# Patient Record
Sex: Female | Born: 1960
Health system: Southern US, Community
[De-identification: ages and names within clinical notes are randomized; demographics above are authoritative.]

## PROBLEM LIST (undated history)

## (undated) DIAGNOSIS — J45909 Unspecified asthma, uncomplicated: Secondary | ICD-10-CM

## (undated) DIAGNOSIS — E785 Hyperlipidemia, unspecified: Secondary | ICD-10-CM

## (undated) DIAGNOSIS — F3289 Other specified depressive episodes: Secondary | ICD-10-CM

## (undated) DIAGNOSIS — Z86010 Personal history of colonic polyps: Secondary | ICD-10-CM

## (undated) DIAGNOSIS — R51 Headache: Secondary | ICD-10-CM

## (undated) DIAGNOSIS — F329 Major depressive disorder, single episode, unspecified: Secondary | ICD-10-CM

## (undated) DIAGNOSIS — F411 Generalized anxiety disorder: Secondary | ICD-10-CM

## (undated) DIAGNOSIS — I10 Essential (primary) hypertension: Secondary | ICD-10-CM

## (undated) DIAGNOSIS — I341 Nonrheumatic mitral (valve) prolapse: Secondary | ICD-10-CM

## (undated) DIAGNOSIS — G8929 Other chronic pain: Secondary | ICD-10-CM

## (undated) DIAGNOSIS — E039 Hypothyroidism, unspecified: Secondary | ICD-10-CM

## (undated) DIAGNOSIS — M549 Dorsalgia, unspecified: Secondary | ICD-10-CM

## (undated) DIAGNOSIS — R002 Palpitations: Secondary | ICD-10-CM

## (undated) HISTORY — PX: OOPHORECTOMY: SHX86

## (undated) HISTORY — DX: Generalized anxiety disorder: F41.1

## (undated) HISTORY — DX: Hyperlipidemia, unspecified: E78.5

## (undated) HISTORY — DX: Essential (primary) hypertension: I10

## (undated) HISTORY — DX: Other chronic pain: G89.29

## (undated) HISTORY — DX: Hypothyroidism, unspecified: E03.9

## (undated) HISTORY — PX: APPENDECTOMY: SHX54

## (undated) HISTORY — DX: Other specified depressive episodes: F32.89

## (undated) HISTORY — DX: Palpitations: R00.2

## (undated) HISTORY — PX: FOOT SURGERY: SHX648

## (undated) HISTORY — DX: Major depressive disorder, single episode, unspecified: F32.9

## (undated) HISTORY — PX: TONSILLECTOMY: SUR1361

## (undated) HISTORY — DX: Dorsalgia, unspecified: M54.9

## (undated) HISTORY — DX: Headache: R51

## (undated) HISTORY — PX: ABDOMINAL HYSTERECTOMY: SHX81

## (undated) HISTORY — DX: Personal history of colonic polyps: Z86.010

## (undated) HISTORY — DX: Unspecified asthma, uncomplicated: J45.909

## (undated) HISTORY — DX: Nonrheumatic mitral (valve) prolapse: I34.1

---

## 1998-03-25 HISTORY — PX: KNEE SURGERY: SHX244

## 1998-05-25 ENCOUNTER — Ambulatory Visit (HOSPITAL_COMMUNITY): Admission: RE | Admit: 1998-05-25 | Discharge: 1998-05-25 | Payer: Self-pay | Admitting: Family Medicine

## 1998-05-25 ENCOUNTER — Encounter: Payer: Self-pay | Admitting: Family Medicine

## 1998-06-23 ENCOUNTER — Ambulatory Visit (HOSPITAL_COMMUNITY): Admission: RE | Admit: 1998-06-23 | Discharge: 1998-06-23 | Payer: Self-pay | Admitting: Family Medicine

## 1998-11-01 ENCOUNTER — Ambulatory Visit (HOSPITAL_COMMUNITY): Admission: RE | Admit: 1998-11-01 | Discharge: 1998-11-01 | Payer: Self-pay | Admitting: *Deleted

## 1999-01-26 ENCOUNTER — Emergency Department (HOSPITAL_COMMUNITY): Admission: EM | Admit: 1999-01-26 | Discharge: 1999-01-26 | Payer: Self-pay | Admitting: Emergency Medicine

## 1999-01-26 ENCOUNTER — Encounter: Payer: Self-pay | Admitting: Emergency Medicine

## 1999-02-19 ENCOUNTER — Encounter: Admission: RE | Admit: 1999-02-19 | Discharge: 1999-04-04 | Payer: Self-pay

## 1999-04-04 ENCOUNTER — Encounter: Admission: RE | Admit: 1999-04-04 | Discharge: 1999-04-04 | Payer: Self-pay | Admitting: *Deleted

## 1999-04-04 ENCOUNTER — Encounter: Payer: Self-pay | Admitting: *Deleted

## 1999-08-30 ENCOUNTER — Other Ambulatory Visit: Admission: RE | Admit: 1999-08-30 | Discharge: 1999-08-30 | Payer: Self-pay | Admitting: Internal Medicine

## 1999-11-06 ENCOUNTER — Encounter: Payer: Self-pay | Admitting: Emergency Medicine

## 1999-11-06 ENCOUNTER — Observation Stay (HOSPITAL_COMMUNITY): Admission: EM | Admit: 1999-11-06 | Discharge: 1999-11-07 | Payer: Self-pay | Admitting: Emergency Medicine

## 1999-11-07 ENCOUNTER — Encounter: Payer: Self-pay | Admitting: *Deleted

## 2001-01-19 ENCOUNTER — Encounter: Payer: Self-pay | Admitting: Neurosurgery

## 2001-01-19 ENCOUNTER — Encounter: Admission: RE | Admit: 2001-01-19 | Discharge: 2001-01-19 | Payer: Self-pay | Admitting: Neurosurgery

## 2001-01-29 ENCOUNTER — Encounter: Payer: Self-pay | Admitting: Neurosurgery

## 2001-01-29 ENCOUNTER — Encounter: Admission: RE | Admit: 2001-01-29 | Discharge: 2001-01-29 | Payer: Self-pay | Admitting: Neurosurgery

## 2001-03-19 ENCOUNTER — Ambulatory Visit (HOSPITAL_COMMUNITY): Admission: RE | Admit: 2001-03-19 | Discharge: 2001-03-19 | Payer: Self-pay | Admitting: Internal Medicine

## 2001-03-19 ENCOUNTER — Encounter: Payer: Self-pay | Admitting: Internal Medicine

## 2001-03-25 HISTORY — PX: CERVICAL FUSION: SHX112

## 2001-04-23 ENCOUNTER — Encounter (INDEPENDENT_AMBULATORY_CARE_PROVIDER_SITE_OTHER): Payer: Self-pay

## 2001-04-24 ENCOUNTER — Inpatient Hospital Stay (HOSPITAL_COMMUNITY): Admission: RE | Admit: 2001-04-24 | Discharge: 2001-04-26 | Payer: Self-pay | Admitting: Obstetrics and Gynecology

## 2002-08-16 ENCOUNTER — Ambulatory Visit (HOSPITAL_COMMUNITY): Admission: RE | Admit: 2002-08-16 | Discharge: 2002-08-16 | Payer: Self-pay | Admitting: Internal Medicine

## 2002-08-16 ENCOUNTER — Encounter: Payer: Self-pay | Admitting: Internal Medicine

## 2002-09-22 ENCOUNTER — Other Ambulatory Visit: Admission: RE | Admit: 2002-09-22 | Discharge: 2002-09-22 | Payer: Self-pay | Admitting: *Deleted

## 2002-11-15 ENCOUNTER — Inpatient Hospital Stay (HOSPITAL_COMMUNITY): Admission: EM | Admit: 2002-11-15 | Discharge: 2002-11-19 | Payer: Self-pay | Admitting: Internal Medicine

## 2002-11-16 ENCOUNTER — Encounter: Payer: Self-pay | Admitting: Internal Medicine

## 2002-11-18 ENCOUNTER — Encounter: Payer: Self-pay | Admitting: Neurological Surgery

## 2004-02-02 ENCOUNTER — Ambulatory Visit: Payer: Self-pay | Admitting: Professional

## 2004-02-09 ENCOUNTER — Ambulatory Visit: Payer: Self-pay | Admitting: Professional

## 2004-02-23 ENCOUNTER — Ambulatory Visit: Payer: Self-pay | Admitting: Professional

## 2004-03-15 ENCOUNTER — Ambulatory Visit: Payer: Self-pay | Admitting: Professional

## 2004-03-29 ENCOUNTER — Ambulatory Visit: Payer: Self-pay | Admitting: Professional

## 2004-04-12 ENCOUNTER — Ambulatory Visit: Payer: Self-pay | Admitting: Professional

## 2004-04-26 ENCOUNTER — Ambulatory Visit: Payer: Self-pay | Admitting: Internal Medicine

## 2004-12-17 ENCOUNTER — Ambulatory Visit: Payer: Self-pay | Admitting: Internal Medicine

## 2005-06-18 ENCOUNTER — Ambulatory Visit: Payer: Self-pay | Admitting: Internal Medicine

## 2005-09-26 ENCOUNTER — Ambulatory Visit: Payer: Self-pay | Admitting: Internal Medicine

## 2005-12-11 ENCOUNTER — Ambulatory Visit: Payer: Self-pay | Admitting: Internal Medicine

## 2006-01-02 ENCOUNTER — Ambulatory Visit: Payer: Self-pay | Admitting: Endocrinology

## 2006-01-03 ENCOUNTER — Ambulatory Visit: Payer: Self-pay

## 2006-02-19 ENCOUNTER — Ambulatory Visit: Payer: Self-pay | Admitting: Internal Medicine

## 2006-02-27 ENCOUNTER — Encounter: Admission: RE | Admit: 2006-02-27 | Discharge: 2006-02-27 | Payer: Self-pay | Admitting: Internal Medicine

## 2006-03-06 ENCOUNTER — Ambulatory Visit: Payer: Self-pay | Admitting: Internal Medicine

## 2006-06-19 ENCOUNTER — Ambulatory Visit: Payer: Self-pay | Admitting: Internal Medicine

## 2006-06-19 LAB — CONVERTED CEMR LAB
Albumin: 4.1 g/dL (ref 3.5–5.2)
Alkaline Phosphatase: 78 units/L (ref 39–117)
BUN: 10 mg/dL (ref 6–23)
Basophils Absolute: 0.1 10*3/uL (ref 0.0–0.1)
Cholesterol: 203 mg/dL (ref 0–200)
Direct LDL: 124.2 mg/dL
Eosinophils Absolute: 0.1 10*3/uL (ref 0.0–0.6)
Eosinophils Relative: 1.6 % (ref 0.0–5.0)
Folate: 11.1 ng/mL
GFR calc Af Amer: 100 mL/min
GFR calc non Af Amer: 83 mL/min
HDL: 48.3 mg/dL (ref >39.0)
MCHC: 34.5 g/dL (ref 30.0–36.0)
MCV: 93 fL (ref 78.0–100.0)
Monocytes Relative: 8.9 % (ref 3.0–11.0)
Platelets: 233 10*3/uL (ref 150–400)
Potassium: 4 meq/L (ref 3.5–5.1)
RBC: 4.18 M/uL (ref 3.87–5.11)
Sodium: 142 meq/L (ref 135–145)
TSH: 3.41 microintl units/mL (ref 0.35–5.50)
Triglycerides: 179 mg/dL — ABNORMAL HIGH (ref 0–149)
Vitamin B-12: 213 pg/mL (ref 211–911)
WBC: 7.7 10*3/uL (ref 4.5–10.5)

## 2006-06-27 ENCOUNTER — Ambulatory Visit (HOSPITAL_COMMUNITY): Admission: RE | Admit: 2006-06-27 | Discharge: 2006-06-27 | Payer: Self-pay | Admitting: Internal Medicine

## 2006-10-01 ENCOUNTER — Ambulatory Visit: Payer: Self-pay | Admitting: Internal Medicine

## 2006-10-02 DIAGNOSIS — R519 Headache, unspecified: Secondary | ICD-10-CM | POA: Insufficient documentation

## 2006-10-02 DIAGNOSIS — J45909 Unspecified asthma, uncomplicated: Secondary | ICD-10-CM | POA: Insufficient documentation

## 2006-10-02 DIAGNOSIS — F32A Depression, unspecified: Secondary | ICD-10-CM | POA: Insufficient documentation

## 2006-10-02 DIAGNOSIS — R51 Headache: Secondary | ICD-10-CM

## 2006-10-02 DIAGNOSIS — G8929 Other chronic pain: Secondary | ICD-10-CM | POA: Insufficient documentation

## 2006-10-02 DIAGNOSIS — F329 Major depressive disorder, single episode, unspecified: Secondary | ICD-10-CM | POA: Insufficient documentation

## 2006-12-22 ENCOUNTER — Ambulatory Visit: Payer: Self-pay | Admitting: Internal Medicine

## 2007-01-06 ENCOUNTER — Ambulatory Visit: Payer: Self-pay | Admitting: Internal Medicine

## 2007-01-07 ENCOUNTER — Encounter: Payer: Self-pay | Admitting: Internal Medicine

## 2007-01-07 DIAGNOSIS — J189 Pneumonia, unspecified organism: Secondary | ICD-10-CM | POA: Insufficient documentation

## 2007-01-07 DIAGNOSIS — R002 Palpitations: Secondary | ICD-10-CM | POA: Insufficient documentation

## 2007-01-07 DIAGNOSIS — G43009 Migraine without aura, not intractable, without status migrainosus: Secondary | ICD-10-CM | POA: Insufficient documentation

## 2007-01-07 DIAGNOSIS — F411 Generalized anxiety disorder: Secondary | ICD-10-CM | POA: Insufficient documentation

## 2007-01-07 DIAGNOSIS — J309 Allergic rhinitis, unspecified: Secondary | ICD-10-CM | POA: Insufficient documentation

## 2007-05-28 ENCOUNTER — Ambulatory Visit: Payer: Self-pay | Admitting: Internal Medicine

## 2007-05-28 DIAGNOSIS — M549 Dorsalgia, unspecified: Secondary | ICD-10-CM | POA: Insufficient documentation

## 2007-06-11 ENCOUNTER — Telehealth (INDEPENDENT_AMBULATORY_CARE_PROVIDER_SITE_OTHER): Payer: Self-pay | Admitting: *Deleted

## 2007-06-12 ENCOUNTER — Ambulatory Visit: Payer: Self-pay | Admitting: Internal Medicine

## 2007-06-12 ENCOUNTER — Telehealth (INDEPENDENT_AMBULATORY_CARE_PROVIDER_SITE_OTHER): Payer: Self-pay | Admitting: *Deleted

## 2007-06-12 DIAGNOSIS — H60399 Other infective otitis externa, unspecified ear: Secondary | ICD-10-CM | POA: Insufficient documentation

## 2008-05-24 ENCOUNTER — Observation Stay (HOSPITAL_COMMUNITY): Admission: EM | Admit: 2008-05-24 | Discharge: 2008-05-25 | Payer: Self-pay | Admitting: Emergency Medicine

## 2008-05-24 ENCOUNTER — Ambulatory Visit: Payer: Self-pay | Admitting: Internal Medicine

## 2008-06-08 ENCOUNTER — Ambulatory Visit: Payer: Self-pay | Admitting: Internal Medicine

## 2008-06-08 DIAGNOSIS — E785 Hyperlipidemia, unspecified: Secondary | ICD-10-CM | POA: Insufficient documentation

## 2008-06-08 DIAGNOSIS — I10 Essential (primary) hypertension: Secondary | ICD-10-CM | POA: Insufficient documentation

## 2008-06-14 ENCOUNTER — Encounter: Admission: RE | Admit: 2008-06-14 | Discharge: 2008-06-14 | Payer: Self-pay | Admitting: Internal Medicine

## 2008-06-14 LAB — HM MAMMOGRAPHY: HM Mammogram: NEGATIVE

## 2008-07-29 ENCOUNTER — Ambulatory Visit: Payer: Self-pay | Admitting: Endocrinology

## 2008-07-29 DIAGNOSIS — J069 Acute upper respiratory infection, unspecified: Secondary | ICD-10-CM | POA: Insufficient documentation

## 2008-08-12 ENCOUNTER — Telehealth: Payer: Self-pay | Admitting: Internal Medicine

## 2008-08-12 ENCOUNTER — Ambulatory Visit: Payer: Self-pay | Admitting: Internal Medicine

## 2008-08-12 DIAGNOSIS — J029 Acute pharyngitis, unspecified: Secondary | ICD-10-CM | POA: Insufficient documentation

## 2008-08-12 LAB — CONVERTED CEMR LAB
ALT: 22 units/L (ref 0–35)
AST: 17 units/L (ref 0–37)
Cholesterol: 189 mg/dL (ref 0–200)
HDL: 44 mg/dL (ref >39.00)
Total Protein: 7.3 g/dL (ref 6.0–8.3)
VLDL: 27 mg/dL (ref 0.0–40.0)

## 2009-04-18 ENCOUNTER — Ambulatory Visit: Payer: Self-pay | Admitting: Internal Medicine

## 2009-04-18 DIAGNOSIS — IMO0002 Reserved for concepts with insufficient information to code with codable children: Secondary | ICD-10-CM | POA: Insufficient documentation

## 2009-06-09 ENCOUNTER — Telehealth (INDEPENDENT_AMBULATORY_CARE_PROVIDER_SITE_OTHER): Payer: Self-pay | Admitting: *Deleted

## 2009-11-02 ENCOUNTER — Ambulatory Visit: Payer: Self-pay | Admitting: Internal Medicine

## 2009-11-02 DIAGNOSIS — J019 Acute sinusitis, unspecified: Secondary | ICD-10-CM | POA: Insufficient documentation

## 2009-11-10 ENCOUNTER — Telehealth (INDEPENDENT_AMBULATORY_CARE_PROVIDER_SITE_OTHER): Payer: Self-pay | Admitting: *Deleted

## 2009-12-12 ENCOUNTER — Encounter: Payer: Self-pay | Admitting: Internal Medicine

## 2010-01-02 ENCOUNTER — Encounter: Payer: Self-pay | Admitting: Internal Medicine

## 2010-03-05 ENCOUNTER — Ambulatory Visit: Payer: Self-pay | Admitting: Internal Medicine

## 2010-04-26 NOTE — Letter (Signed)
Summary: [facility]   Imported By: Sherian Rein 02/09/2010 11:14:43  _____________________________________________________________________  External Attachment:    Type:   Image     Comment:   External Document

## 2010-04-26 NOTE — Assessment & Plan Note (Signed)
Summary: SINUS/EARS/NWS   Vital Signs:  Patient profile:   50 year old female Height:      70.5 inches Weight:      248.38 pounds BMI:     35.26 O2 Sat:      97 % on Room air Temp:     97.8 degrees F oral Pulse rate:   81 / minute BP sitting:   126 / 80  (left arm) Cuff size:   large  Vitals Entered By: Zella Ball Ewing CMA Duncan Dull) (November 02, 2009 11:25 AM)  O2 Flow:  Room air CC: sinus congestion, face swollen, ear discomfort/RE   CC:  sinus congestion, face swollen, and ear discomfort/RE.  History of Present Illness: here with w2 -3 days onset right facial pain, pressure, fever and greenish d/c, with slight ST but no cough and Pt denies CP, sob, doe, wheezing, orthopnea, pnd, worsening LE edema, palps, dizziness or syncope   Pt denies new neuro symptoms such as headache, facial or extremity weakness   Does have increased severity overall of the lower back pain, unable to sleep lying flat in bed as pain is excruciating;  was not able to consider ortho or MRI after last visit jan 2011 due to cost;  now with pain similar as per HPI jan 2011 just more constant and more severe.  No falls or injury. No worsening bowel or bladder symtpom. No fever, wt loss, night sweats, loss of appetite or other constitutional symptoms Very tearful - " I just want to get it fixed"  Stopped all of her meds in frustration 2 mo ago, now states she is wiling to take her meds, needs refills overall.  no tolerability issues.  Denies suicidal ideation or panic  Problems Prior to Update: 1)  Sinusitis- Acute-nos  (ICD-461.9) 2)  Lumbar Radiculopathy, Left  (ICD-724.4) 3)  Back Pain  (ICD-724.5) 4)  Hepatotoxicity, Drug-induced, Risk of  (ICD-V58.69) 5)  Acute Pharyngitis  (ICD-462) 6)  Uri  (ICD-465.9) 7)  Hyperlipidemia  (ICD-272.4) 8)  Hypertension  (ICD-401.9) 9)  External Otitis  (ICD-380.10) 10)  Back Pain  (ICD-724.5) 11)  Pneumonia, Organism Nos  (ICD-486) 12)  Allergic Rhinitis  (ICD-477.9) 13)  Common  Migraine  (ICD-346.10) 14)  Symptom, Palpitations  (ICD-785.1) 15)  Anxiety  (ICD-300.00) 16)  Asthma  (ICD-493.90) 17)  Pain, Chronic Nec  (ICD-338.29) 18)  Headache  (ICD-784.0) 19)  Depression  (ICD-311)  Medications Prior to Update: 1)  Alprazolam 0.5 Mg  Tabs (Alprazolam) .Marland Kitchen.. 1 By Mouth Once Daily As Needed 2)  Fluoxetine Hcl 40 Mg Caps (Fluoxetine Hcl) .Marland Kitchen.. 1 By Mouth Once Daily 3)  Oxycodone Hcl 5 Mg Tabs (Oxycodone Hcl) .Marland Kitchen.. 1 - 3 By Mouth Q 6 Hrs As Needed Pain 4)  Robaxin-750 750 Mg Tabs (Methocarbamol) .Marland Kitchen.. 1 By Mouth Two Times A Day As Needed 5)  Metoprolol Tartrate 50 Mg Tabs (Metoprolol Tartrate) .Marland Kitchen.. 1 By Mouth Two Times A Day 6)  Pravachol 20 Mg Tabs (Pravastatin Sodium) .Marland Kitchen.. 1 By Mouth Once Daily 7)  Cephalexin 500 Mg Caps (Cephalexin) .Marland Kitchen.. 1 By Mouth Three Times A Day 8)  Fioricet 50-325-40 Mg Tabs (Butalbital-Apap-Caffeine) .Marland Kitchen.. 1po Once Daily As Needed 9)  Tessalon Perles 100 Mg Caps (Benzonatate) .Marland Kitchen.. 1 -2 By Mouth Three Times A Day As Needed Cough  Current Medications (verified): 1)  Alprazolam 0.5 Mg  Tabs (Alprazolam) .Marland Kitchen.. 1 By Mouth Once Daily As Needed 2)  Fluoxetine Hcl 40 Mg Caps (Fluoxetine Hcl) .Marland Kitchen.. 1 By Mouth  Once Daily 3)  Oxycodone Hcl 5 Mg Tabs (Oxycodone Hcl) .Marland Kitchen.. 1 - 3 By Mouth Q 6 Hrs As Needed Pain 4)  Robaxin-750 750 Mg Tabs (Methocarbamol) .Marland Kitchen.. 1 By Mouth Two Times A Day As Needed 5)  Metoprolol Tartrate 50 Mg Tabs (Metoprolol Tartrate) .Marland Kitchen.. 1 By Mouth Two Times A Day 6)  Pravachol 20 Mg Tabs (Pravastatin Sodium) .Marland Kitchen.. 1 By Mouth Once Daily 7)  Clarithromycin 500 Mg Tabs (Clarithromycin) .Marland Kitchen.. 1po Two Times A Day 8)  Fioricet 50-325-40 Mg Tabs (Butalbital-Apap-Caffeine) .Marland Kitchen.. 1po Once Daily As Needed  Allergies (verified): 1)  ! Sulfa 2)  ! Ecotrin Low Strength (Aspirin)  Past History:  Past Medical History: Last updated: 04/18/2009 Depression Headache Pain Asthma pneumonia 9/08 symptomatic palpitations Anxiety migraine Allergic  rhinitis Hypertension Hyperlipidemia recurrent LBP  Past Surgical History: Last updated: 10/02/2006 Caesarean section x2 Oophorectomy 1996/2000 Hysterectomy 1997 Appendectomy Tonsillectomy Left foot sugery 1997 Right Knee Surgery 2000 - Cartilage problem  Social History: Last updated: 06/12/2007 Current Smoker Alcohol use-yes Married  Risk Factors: Smoking Status: current (01/07/2007)  Review of Systems       all otherwise negative per pt -    Physical Exam  General:  alert and overweight-appearing.  , tearfull, mild ill  Head:  normocephalic and atraumatic.   Eyes:  vision grossly intact, pupils equal, and pupils round.   Ears:  bilat tms' mild red, sinus tender right > left Nose:  nasal dischargemucosal pallor and mucosal edema.   Mouth:  pharyngeal erythema and fair dentition.   Neck:  supple and no masses.   Lungs:  normal respiratory effort and normal breath sounds.   Heart:  normal rate and regular rhythm.   Abdomen:  soft, non-tender, and normal bowel sounds.   Msk:  spine nontender midline, has some tender mid lumbar paravertebral and upper left buttock area Neurologic:  cranial nerves II-XII intact, strength ? normal in lower extremities, and sensation intact to light touch.  except decreased sensation to LT to l2, l3 areas; and strength exam limited due to pain and effort   Impression & Recommendations:  Problem # 1:  SINUSITIS- ACUTE-NOS (ICD-461.9)  The following medications were removed from the medication list:    Tessalon Perles 100 Mg Caps (Benzonatate) .Marland Kitchen... 1 -2 by mouth three times a day as needed cough Her updated medication list for this problem includes:    Clarithromycin 500 Mg Tabs (Clarithromycin) .Marland Kitchen... 1po two times a day treat as above, f/u any worsening signs or symptoms   Problem # 2:  LUMBAR RADICULOPATHY, LEFT (ICD-724.4)  Her updated medication list for this problem includes:    Oxycodone Hcl 5 Mg Tabs (Oxycodone hcl) .Marland Kitchen... 1 -  3 by mouth q 6 hrs as needed pain    Robaxin-750 750 Mg Tabs (Methocarbamol) .Marland Kitchen... 1 by mouth two times a day as needed    Fioricet 50-325-40 Mg Tabs (Butalbital-apap-caffeine) .Marland Kitchen... 1po once daily as needed persists - worse overall pain subjective per pt;  exam little change ;  unable to get MRI done in [place] due to cost;  was not seen by local orhto as she owed them money;  ok for med refills, and refer [facility] ortho as may be able to get better tx there /more affordable  Problem # 3:  HYPERTENSION (ICD-401.9)  Her updated medication list for this problem includes:    Metoprolol Tartrate 50 Mg Tabs (Metoprolol tartrate) .Marland Kitchen... 1 by mouth two times a day  BP today: 126/80  Prior BP: 124/70 (04/18/2009)  Labs Reviewed: K+: 4.0 (06/19/2006) Creat: : 0.8 (06/19/2006)   Chol: 189 (08/12/2008)   HDL: 44.00 (08/12/2008)   LDL: 118 (08/12/2008)   TG: 135.0 (08/12/2008) stable overall by hx and exam, ok to continue meds/tx as is   Problem # 4:  HYPERLIPIDEMIA (ICD-272.4)  Her updated medication list for this problem includes:    Pravachol 20 Mg Tabs (Pravastatin sodium) .Marland Kitchen... 1 by mouth once daily  Labs Reviewed: SGOT: 17 (08/12/2008)   SGPT: 22 (08/12/2008)   HDL:44.00 (08/12/2008), 48.3 (06/19/2006)  LDL:118 (08/12/2008), DEL (06/19/2006)  Chol:189 (08/12/2008), 203 (06/19/2006)  Trig:135.0 (08/12/2008), 179 (06/19/2006) re-start med, f/u next visit  Complete Medication List: 1)  Alprazolam 0.5 Mg Tabs (Alprazolam) .Marland Kitchen.. 1 by mouth once daily as needed 2)  Fluoxetine Hcl 40 Mg Caps (Fluoxetine hcl) .Marland Kitchen.. 1 by mouth once daily 3)  Oxycodone Hcl 5 Mg Tabs (Oxycodone hcl) .Marland Kitchen.. 1 - 3 by mouth q 6 hrs as needed pain 4)  Robaxin-750 750 Mg Tabs (Methocarbamol) .Marland Kitchen.. 1 by mouth two times a day as needed 5)  Metoprolol Tartrate 50 Mg Tabs (Metoprolol tartrate) .Marland Kitchen.. 1 by mouth two times a day 6)  Pravachol 20 Mg Tabs (Pravastatin sodium) .Marland Kitchen.. 1 by mouth once daily 7)  Clarithromycin 500 Mg Tabs  (Clarithromycin) .Marland Kitchen.. 1po two times a day 8)  Fioricet 50-325-40 Mg Tabs (Butalbital-apap-caffeine) .Marland Kitchen.. 1po once daily as needed  Patient Instructions: 1)  Please take all new medications as prescribed  2)  Continue all previous medications as before this visit  3)  You will be contacted about the referral(s) to: orthopedic/[facility] 4)  Please schedule a follow-up appointment as needed. Prescriptions: PRAVACHOL 20 MG TABS (PRAVASTATIN SODIUM) 1 by mouth once daily  #90 x 3   Entered and Authorized by:   Corwin Levins MD   Signed by:   Corwin Levins MD on 11/02/2009   Method used:   Print then Give to Patient   RxID:   [id number] METOPROLOL TARTRATE 50 MG TABS (METOPROLOL TARTRATE) 1 by mouth two times a day  #60 x 11   Entered and Authorized by:   Corwin Levins MD   Signed by:   Corwin Levins MD on 11/02/2009   Method used:   Print then Give to Patient   RxID:   [id number] ROBAXIN-750 750 MG TABS (METHOCARBAMOL) 1 by mouth two times a day as needed  #60 x 2   Entered and Authorized by:   Corwin Levins MD   Signed by:   Corwin Levins MD on 11/02/2009   Method used:   Print then Give to Patient   RxID:   [id number] OXYCODONE HCL 5 MG TABS (OXYCODONE HCL) 1 - 3 by mouth q 6 hrs as needed pain  #120 x 0   Entered and Authorized by:   Corwin Levins MD   Signed by:   Corwin Levins MD on 11/02/2009   Method used:   Print then Give to Patient   RxID:   [id number] ALPRAZOLAM 0.5 MG  TABS (ALPRAZOLAM) 1 by mouth once daily as needed  #30 x 2   Entered and Authorized by:   Corwin Levins MD   Signed by:   Corwin Levins MD on 11/02/2009   Method used:   Print then Give to Patient   RxID:   [id number] FLUOXETINE HCL 40 MG CAPS (FLUOXETINE HCL) 1 by mouth once daily  #90  x 3   Entered and Authorized by:   Corwin Levins MD   Signed by:   Corwin Levins MD on 11/02/2009   Method used:   Print then Give to Patient   RxID:   [id number] CLARITHROMYCIN 500 MG TABS  (CLARITHROMYCIN) 1po two times a day  #20 x 0   Entered and Authorized by:   Corwin Levins MD   Signed by:   Corwin Levins MD on 11/02/2009   Method used:   Print then Give to Patient   RxID:   [id number]

## 2010-04-26 NOTE — Letter (Signed)
Summary: [facility]   Imported By: Lester Carthage 12/18/2009 09:10:09  _____________________________________________________________________  External Attachment:    Type:   Image     Comment:   External Document

## 2010-04-26 NOTE — Letter (Signed)
Summary: [facility]  [facility]   Imported By: Lennie Odor 01/08/2010 15:46:24  _____________________________________________________________________  External Attachment:    Type:   Image     Comment:   External Document

## 2010-04-26 NOTE — Progress Notes (Signed)
Summary: Records request from [facility]  Request for records received from [facility]. Request forwarded to [employer]. [name]  June 09, 2009 4:37 PM

## 2010-04-26 NOTE — Progress Notes (Signed)
Summary: REFERRRAL  Phone Note Call from Patient Call back at Home Phone [phone] Call back at [phone]   Caller: Patient Call For: Corwin Levins MD Summary of Call: Pt wants to know status of referral to [facility] for her lower back and neck. Pt was seen a wk ago and has had no calls. Please let pt know the status. Initial call taken by: Verdell Face,  November 10, 2009 10:11 AM  Follow-up for Phone Call        I woul defer to [facility] Follow-up by: Corwin Levins MD,  November 10, 2009 5:27 PM  Additional Follow-up for Phone Call Additional follow up Details #1::        There is no order  put in  for patient. Additional Follow-up by: Dagoberto Reef,  November 13, 2009 2:33 PM    Additional Follow-up for Phone Call Additional follow up Details #2::    done now Follow-up by: Corwin Levins MD,  November 13, 2009 2:55 PM

## 2010-04-26 NOTE — Assessment & Plan Note (Signed)
Summary: SORE THROAT--COUGH--SWOLLEN GLANDS---STC   Vital Signs:  Patient profile:   50 year old female Height:      70.5 inches Weight:      252.25 pounds BMI:     35.81 O2 Sat:      97 % on Room air Temp:     99.4 degrees F oral Pulse rate:   107 / minute BP sitting:   138 / 80  (left arm) Cuff size:   large  Vitals Entered By: Zella Ball Ewing CMA Duncan Dull) (March 05, 2010 4:28 PM)  O2 Flow:  Room air CC: Sore throat, cough, congestion, right ear pain/RE   CC:  Sore throat, cough, congestion, and right ear pain/RE.  History of Present Illness: here with acute onset 1-2 days mild to mod facial pain, pressure, fever, and greenish d/c;  has mild ST, and small non prod cough;  Pt denies CP, worsening sob, doe, wheezing, orthopnea, pnd, worsening LE edema, palps, dizziness or syncope  Pt denies new neuro symptoms such as headache, facial or extremity weakness Pt denies polydipsia, polyuria,  Preventive Screening-Counseling & Management      Drug Use:  no.    Problems Prior to Update: 1)  Sinusitis- Acute-nos  (ICD-461.9) 2)  Lumbar Radiculopathy, Left  (ICD-724.4) 3)  Back Pain  (ICD-724.5) 4)  Hepatotoxicity, Drug-induced, Risk of  (ICD-V58.69) 5)  Acute Pharyngitis  (ICD-462) 6)  Uri  (ICD-465.9) 7)  Hyperlipidemia  (ICD-272.4) 8)  Hypertension  (ICD-401.9) 9)  External Otitis  (ICD-380.10) 10)  Back Pain  (ICD-724.5) 11)  Pneumonia, Organism Nos  (ICD-486) 12)  Allergic Rhinitis  (ICD-477.9) 13)  Common Migraine  (ICD-346.10) 14)  Symptom, Palpitations  (ICD-785.1) 15)  Anxiety  (ICD-300.00) 16)  Asthma  (ICD-493.90) 17)  Pain, Chronic Nec  (ICD-338.29) 18)  Headache  (ICD-784.0) 19)  Depression  (ICD-311)  Medications Prior to Update: 1)  Alprazolam 0.5 Mg  Tabs (Alprazolam) .Marland Kitchen.. 1 By Mouth Once Daily As Needed 2)  Fluoxetine Hcl 40 Mg Caps (Fluoxetine Hcl) .Marland Kitchen.. 1 By Mouth Once Daily 3)  Oxycodone Hcl 5 Mg Tabs (Oxycodone Hcl) .Marland Kitchen.. 1 - 3 By Mouth Q 6 Hrs As Needed  Pain 4)  Robaxin-750 750 Mg Tabs (Methocarbamol) .Marland Kitchen.. 1 By Mouth Two Times A Day As Needed 5)  Metoprolol Tartrate 50 Mg Tabs (Metoprolol Tartrate) .Marland Kitchen.. 1 By Mouth Two Times A Day 6)  Pravachol 20 Mg Tabs (Pravastatin Sodium) .Marland Kitchen.. 1 By Mouth Once Daily 7)  Clarithromycin 500 Mg Tabs (Clarithromycin) .Marland Kitchen.. 1po Two Times A Day 8)  Fioricet 50-325-40 Mg Tabs (Butalbital-Apap-Caffeine) .Marland Kitchen.. 1po Once Daily As Needed  Current Medications (verified): 1)  Alprazolam 0.5 Mg  Tabs (Alprazolam) .Marland Kitchen.. 1 By Mouth Once Daily As Needed 2)  Fluoxetine Hcl 40 Mg Caps (Fluoxetine Hcl) .Marland Kitchen.. 1 By Mouth Once Daily 3)  Oxycodone Hcl 5 Mg Tabs (Oxycodone Hcl) .Marland Kitchen.. 1 - 3 By Mouth Q 6 Hrs As Needed Pain 4)  Robaxin-750 750 Mg Tabs (Methocarbamol) .Marland Kitchen.. 1 By Mouth Two Times A Day As Needed 5)  Metoprolol Tartrate 50 Mg Tabs (Metoprolol Tartrate) .Marland Kitchen.. 1 By Mouth Two Times A Day 6)  Pravachol 20 Mg Tabs (Pravastatin Sodium) .Marland Kitchen.. 1 By Mouth Once Daily 7)  Doxycycline Hyclate 100 Mg Caps (Doxycycline Hyclate) .Marland Kitchen.. 1po Two Times A Day 8)  Fioricet 50-325-40 Mg Tabs (Butalbital-Apap-Caffeine) .Marland Kitchen.. 1po Once Daily As Needed 9)  Tessalon Perles 100 Mg Caps (Benzonatate) .Marland Kitchen.. 1-2 By Mouth Three Times A Day As Needed  Cough  Allergies (verified): 1)  ! Sulfa 2)  ! Ecotrin Low Strength (Aspirin)  Past History:  Past Medical History: Last updated: 04/18/2009 Depression Headache Pain Asthma pneumonia 9/08 symptomatic palpitations Anxiety migraine Allergic rhinitis Hypertension Hyperlipidemia recurrent LBP  Past Surgical History: Last updated: 10/02/2006 Caesarean section x2 Oophorectomy 1996/2000 Hysterectomy 1997 Appendectomy Tonsillectomy Left foot sugery 1997 Right Knee Surgery 2000 - Cartilage problem  Social History: Last updated: 03/05/2010 Current Smoker Alcohol use-yes Married Drug use-no  Risk Factors: Smoking Status: current (01/07/2007)  Social History: Current Smoker Alcohol  use-yes Married Drug use-no Drug Use:  no  Review of Systems       all otherwise negative per pt -    Physical Exam  General:  alert and overweight-appearing.  , mild ill  Head:  normocephalic and atraumatic.   Eyes:  vision grossly intact, pupils equal, and pupils round.   Ears:  bilat tm's erythema, left > rigth without bulging and canals clear, sinus tender bilat Nose:  nasal dischargemucosal pallor and mucosal edema.   Mouth:  pharyngeal erythema and fair dentition.   Neck:  supple and cervical lymphadenopathy - tender Lungs:  normal respiratory effort and normal breath sounds.   Heart:  normal rate and regular rhythm.   Extremities:  no edema, no erythema    Impression & Recommendations:  Problem # 1:  SINUSITIS- ACUTE-NOS (ICD-461.9)  Her updated medication list for this problem includes:    Doxycycline Hyclate 100 Mg Caps (Doxycycline hyclate) .Marland Kitchen... 1po two times a day    Tessalon Perles 100 Mg Caps (Benzonatate) .Marland Kitchen... 1-2 by mouth three times a day as needed cough treat as above, f/u any worsening signs or symptoms   Complete Medication List: 1)  Alprazolam 0.5 Mg Tabs (Alprazolam) .Marland Kitchen.. 1 by mouth once daily as needed 2)  Fluoxetine Hcl 40 Mg Caps (Fluoxetine hcl) .Marland Kitchen.. 1 by mouth once daily 3)  Oxycodone Hcl 5 Mg Tabs (Oxycodone hcl) .Marland Kitchen.. 1 - 3 by mouth q 6 hrs as needed pain 4)  Robaxin-750 750 Mg Tabs (Methocarbamol) .Marland Kitchen.. 1 by mouth two times a day as needed 5)  Metoprolol Tartrate 50 Mg Tabs (Metoprolol tartrate) .Marland Kitchen.. 1 by mouth two times a day 6)  Pravachol 20 Mg Tabs (Pravastatin sodium) .Marland Kitchen.. 1 by mouth once daily 7)  Doxycycline Hyclate 100 Mg Caps (Doxycycline hyclate) .Marland Kitchen.. 1po two times a day 8)  Fioricet 50-325-40 Mg Tabs (Butalbital-apap-caffeine) .Marland Kitchen.. 1po once daily as needed 9)  Tessalon Perles 100 Mg Caps (Benzonatate) .Marland Kitchen.. 1-2 by mouth three times a day as needed cough  Patient Instructions: 1)  Please take all new medications as prescribed 2)   Continue all previous medications as before this visit  3)  Please schedule a follow-up appointment as needed. Prescriptions: TESSALON PERLES 100 MG CAPS (BENZONATATE) 1-2 by mouth three times a day as needed cough  #60 x 0   Entered and Authorized by:   Corwin Levins MD   Signed by:   Corwin Levins MD on 03/05/2010   Method used:   Print then Give to Patient   RxID:   [id number] DOXYCYCLINE HYCLATE 100 MG CAPS (DOXYCYCLINE HYCLATE) 1po two times a day  #20 x 0   Entered and Authorized by:   Corwin Levins MD   Signed by:   Corwin Levins MD on 03/05/2010   Method used:   Print then Give to Patient   RxID:   [id number]    Orders Added: 1)  Est. Patient Level III [id number]

## 2010-04-26 NOTE — Assessment & Plan Note (Signed)
Summary: severe back pain into legs--d/t--er/urg care if worsen--stc   Vital Signs:  Patient profile:   50 year old female Height:      69 inches Weight:      239 pounds BMI:     35.42 O2 Sat:      98 % on Room air Temp:     97.8 degrees F oral Pulse rate:   89 / minute BP sitting:   124 / 70  (left arm) Cuff size:   large  Vitals Entered ByMarland Kitchen Zella Ball Ewing (April 18, 2009 3:10 PM)  O2 Flow:  Room air CC: back pain, refills,cough/RE   CC:  back pain, refills, and cough/RE.  History of Present Illness: here with 4 wks ongoing severe pain, cant sleep, 8/10,  across the lower back, radiaties to below the knee on the left side;  no bowel or bladder symtpoms; no falls , fever, wt loss, night sweats;  worse to stand up, bend over to tie the shoes or turning or twisting;  was not able to be seen per ortho with last episode of pain flare due to owing money to ortho.  Using the hydrocodone sparingly but not helpign anyway.  flexeril not working as well.    also incidently with facial pain, pressure, and greenish d/c for 3 days.   Problems Prior to Update: 1)  Pharyngitis-acute  (ICD-462) 2)  Lumbar Radiculopathy, Left  (ICD-724.4) 3)  Back Pain  (ICD-724.5) 4)  Hepatotoxicity, Drug-induced, Risk of  (ICD-V58.69) 5)  Acute Pharyngitis  (ICD-462) 6)  Uri  (ICD-465.9) 7)  Hyperlipidemia  (ICD-272.4) 8)  Hypertension  (ICD-401.9) 9)  External Otitis  (ICD-380.10) 10)  Back Pain  (ICD-724.5) 11)  Pneumonia, Organism Nos  (ICD-486) 12)  Allergic Rhinitis  (ICD-477.9) 13)  Common Migraine  (ICD-346.10) 14)  Symptom, Palpitations  (ICD-785.1) 15)  Anxiety  (ICD-300.00) 16)  Asthma  (ICD-493.90) 17)  Pain, Chronic Nec  (ICD-338.29) 18)  Headache  (ICD-784.0) 19)  Depression  (ICD-311)  Medications Prior to Update: 1)  Alprazolam 0.5 Mg  Tabs (Alprazolam) .Marland Kitchen.. 1 By Mouth Once Daily As Needed 2)  Fluoxetine Hcl 40 Mg Caps (Fluoxetine Hcl) .Marland Kitchen.. 1 By Mouth Once Daily 3)  Vicodin 5-500 Mg   Tabs (Hydrocodone-Acetaminophen) .Marland Kitchen.. 1po Three Times A Day Prn 4)  Flexeril 5 Mg  Tabs (Cyclobenzaprine Hcl) .Marland Kitchen.. 1 By Mouth Three Times A Day Prn 5)  Metoprolol Tartrate 50 Mg Tabs (Metoprolol Tartrate) .Marland Kitchen.. 1 By Mouth Two Times A Day 6)  Pravachol 20 Mg Tabs (Pravastatin Sodium) .Marland Kitchen.. 1 By Mouth Once Daily 7)  Azithromycin 250 Mg Tabs (Azithromycin) .... 2po Qd For 1 Day, Then 1po Qd For 4days, Then Stop 8)  Fioricet 50-325-40 Mg Tabs (Butalbital-Apap-Caffeine) .Marland Kitchen.. 1po Once Daily As Needed  Current Medications (verified): 1)  Alprazolam 0.5 Mg  Tabs (Alprazolam) .Marland Kitchen.. 1 By Mouth Once Daily As Needed 2)  Fluoxetine Hcl 40 Mg Caps (Fluoxetine Hcl) .Marland Kitchen.. 1 By Mouth Once Daily 3)  Oxycodone Hcl 5 Mg Tabs (Oxycodone Hcl) .Marland Kitchen.. 1 - 3 By Mouth Q 6 Hrs As Needed Pain 4)  Robaxin-750 750 Mg Tabs (Methocarbamol) .Marland Kitchen.. 1 By Mouth Two Times A Day As Needed 5)  Metoprolol Tartrate 50 Mg Tabs (Metoprolol Tartrate) .Marland Kitchen.. 1 By Mouth Two Times A Day 6)  Pravachol 20 Mg Tabs (Pravastatin Sodium) .Marland Kitchen.. 1 By Mouth Once Daily 7)  Cephalexin 500 Mg Caps (Cephalexin) .Marland Kitchen.. 1 By Mouth Three Times A Day 8)  Fioricet 50-325-40  Mg Tabs (Butalbital-Apap-Caffeine) .Marland Kitchen.. 1po Once Daily As Needed 9)  Tessalon Perles 100 Mg Caps (Benzonatate) .Marland Kitchen.. 1 -2 By Mouth Three Times A Day As Needed Cough  Allergies (verified): 1)  ! Sulfa 2)  ! Ecotrin Low Strength (Aspirin)  Past History:  Past Surgical History: Last updated: 10/02/2006 Caesarean section x2 Oophorectomy 1996/2000 Hysterectomy 1997 Appendectomy Tonsillectomy Left foot sugery 1997 Right Knee Surgery 2000 - Cartilage problem  Social History: Last updated: 06/12/2007 Current Smoker Alcohol use-yes Married  Risk Factors: Smoking Status: current (01/07/2007)  Past Medical History: Depression Headache Pain Asthma pneumonia 9/08 symptomatic palpitations Anxiety migraine Allergic rhinitis Hypertension Hyperlipidemia recurrent LBP  Review of  Systems       all otherwise negative per pt -   Physical Exam  General:  alert and overweight-appearing.   Head:  normocephalic and atraumatic.   Eyes:  vision grossly intact, pupils equal, and pupils round.   Ears:  bilat tm's red, sinus tender bialt Nose:  nasal dischargemucosal pallor and mucosal erythema.   Mouth:  pharyngeal erythema and fair dentition.   Neck:  supple and no masses.   Lungs:  normal respiratory effort and normal breath sounds.   Heart:  normal rate and regular rhythm.   Abdomen:  soft, non-tender, and normal bowel sounds.   Msk:  spine nontender midline, has some tender mid lumbar paravertebral and upper left buttock area Extremities:  no edema, no erythema  Neurologic:  cranial nerves II-XII intact, strength ? normal in lower extremities, and sensation intact to light touch.  except decreased sensation to LT to l2, l3 areas; and strength exam limited due to pain and effort   Impression & Recommendations:  Problem # 1:  LUMBAR RADICULOPATHY, LEFT (ICD-724.4)  Her updated medication list for this problem includes:    Oxycodone Hcl 5 Mg Tabs (Oxycodone hcl) .Marland Kitchen... 1 - 3 by mouth q 6 hrs as needed pain    Robaxin-750 750 Mg Tabs (Methocarbamol) .Marland Kitchen... 1 by mouth two times a day as needed    Fioricet 50-325-40 Mg Tabs (Butalbital-apap-caffeine) .Marland Kitchen... 1po once daily as needed treat as above, f/u any worsening signs or symptoms , after toradol in the office today, and prednisone burst and taper off as well as above;  declines MRI due to cost for now , as well as ortho f/u  Orders: Ketorolac-Toradol 15mg  (E4540) Ketorolac-Toradol 15mg  (J8119) Admin of Therapeutic Inj  intramuscular or subcutaneous (14782)  Problem # 2:  PHARYNGITIS-ACUTE (ICD-462)  Her updated medication list for this problem includes:    Cephalexin 500 Mg Caps (Cephalexin) .Marland Kitchen... 1 by mouth three times a day treat as above, f/u any worsening signs or symptoms   Complete Medication List: 1)   Alprazolam 0.5 Mg Tabs (Alprazolam) .Marland Kitchen.. 1 by mouth once daily as needed 2)  Fluoxetine Hcl 40 Mg Caps (Fluoxetine hcl) .Marland Kitchen.. 1 by mouth once daily 3)  Oxycodone Hcl 5 Mg Tabs (Oxycodone hcl) .Marland Kitchen.. 1 - 3 by mouth q 6 hrs as needed pain 4)  Robaxin-750 750 Mg Tabs (Methocarbamol) .Marland Kitchen.. 1 by mouth two times a day as needed 5)  Metoprolol Tartrate 50 Mg Tabs (Metoprolol tartrate) .Marland Kitchen.. 1 by mouth two times a day 6)  Pravachol 20 Mg Tabs (Pravastatin sodium) .Marland Kitchen.. 1 by mouth once daily 7)  Cephalexin 500 Mg Caps (Cephalexin) .Marland Kitchen.. 1 by mouth three times a day 8)  Fioricet 50-325-40 Mg Tabs (Butalbital-apap-caffeine) .Marland Kitchen.. 1po once daily as needed 9)  Tessalon Perles 100 Mg Caps (Benzonatate) .Marland KitchenMarland KitchenMarland Kitchen  1 -2 by mouth three times a day as needed cough  Patient Instructions: 1)  you had the pain shot today (toradol) 2)  Please take all new medications as prescribed - the oxycodone for pain, robaxin generic for muscle relaxer, and prednisone for inflammation, cephalexin for antibiotic, and tessalon for cough as needed 3)  Continue all previous medications as before this visit , including the pravachol  4)  Please schedule a follow-up appointment as needed. Prescriptions: TESSALON PERLES 100 MG CAPS (BENZONATATE) 1 -2 by mouth three times a day as needed cough  #60 x 1   Entered and Authorized by:   Corwin Levins MD   Signed by:   Corwin Levins MD on 04/18/2009   Method used:   Print then Give to Patient   RxID:   [id number] ROBAXIN-750 750 MG TABS (METHOCARBAMOL) 1 by mouth two times a day as needed  #60 x 2   Entered and Authorized by:   Corwin Levins MD   Signed by:   Corwin Levins MD on 04/18/2009   Method used:   Print then Give to Patient   RxID:   [id number] PRAVACHOL 20 MG TABS (PRAVASTATIN SODIUM) 1 by mouth once daily  #90 x 3   Entered and Authorized by:   Corwin Levins MD   Signed by:   Corwin Levins MD on 04/18/2009   Method used:   Print then Give to Patient   RxID:    [id number] CEPHALEXIN 500 MG CAPS (CEPHALEXIN) 1 by mouth three times a day  #30 x 0   Entered and Authorized by:   Corwin Levins MD   Signed by:   Corwin Levins MD on 04/18/2009   Method used:   Print then Give to Patient   RxID:   [id number] OXYCODONE HCL 5 MG TABS (OXYCODONE HCL) 1 - 3 by mouth q 6 hrs as needed pain  #100 x 0   Entered and Authorized by:   Corwin Levins MD   Signed by:   Corwin Levins MD on 04/18/2009   Method used:   Print then Give to Patient   RxID:   [id number]    Medication Administration  Injection # 1:    Medication: Ketorolac-Toradol 15mg     Diagnosis: LUMBAR RADICULOPATHY, LEFT (ICD-724.4)    Route: IM    Site: RUOQ gluteus    Exp Date: 10/24/2010    Lot #: [id number]    Mfr: Novaplus    Given by: Zella Ball Ewing (April 18, 2009 3:35 PM)  Injection # 2:    Medication: Ketorolac-Toradol 15mg     Diagnosis: LUMBAR RADICULOPATHY, LEFT (ICD-724.4)    Route: IM    Site: RUOQ gluteus    Exp Date: 10/24/2010    Lot #: [id number]    Mfr: Novaplus    Given by: Zella Ball Ewing (April 18, 2009 3:35 PM)  Orders Added: 1)  Ketorolac-Toradol 15mg  [J1885] 2)  Ketorolac-Toradol 15mg  [J1885] 3)  Admin of Therapeutic Inj  intramuscular or subcutaneous [96372] 4)  Est. Patient Level IV [01093]

## 2010-06-04 ENCOUNTER — Other Ambulatory Visit: Payer: Self-pay | Admitting: Internal Medicine

## 2010-06-04 ENCOUNTER — Ambulatory Visit (INDEPENDENT_AMBULATORY_CARE_PROVIDER_SITE_OTHER): Payer: Self-pay | Admitting: Internal Medicine

## 2010-06-04 ENCOUNTER — Encounter: Payer: Self-pay | Admitting: Internal Medicine

## 2010-06-04 ENCOUNTER — Ambulatory Visit (INDEPENDENT_AMBULATORY_CARE_PROVIDER_SITE_OTHER)
Admission: RE | Admit: 2010-06-04 | Discharge: 2010-06-04 | Disposition: A | Discharge status: Home / Self Care | Payer: Self-pay | Source: Ambulatory Visit | Attending: Internal Medicine | Admitting: Internal Medicine

## 2010-06-04 DIAGNOSIS — J209 Acute bronchitis, unspecified: Secondary | ICD-10-CM

## 2010-06-04 DIAGNOSIS — M549 Dorsalgia, unspecified: Secondary | ICD-10-CM

## 2010-06-04 DIAGNOSIS — I10 Essential (primary) hypertension: Secondary | ICD-10-CM

## 2010-06-12 NOTE — Assessment & Plan Note (Signed)
Summary: BACK PAIN / COUGH /NWS   Vital Signs:  Patient profile:   50 year old female Height:      71 inches Weight:      257.25 pounds BMI:     36.01 O2 Sat:      96 % on Room air Temp:     98.7 degrees F oral Pulse rate:   104 / minute BP sitting:   118 / 80  (left arm) Cuff size:   large  Vitals Entered By: Zella Ball Ewing CMA Duncan Dull) (June 04, 2010 2:32 PM)  O2 Flow:  Room air CC: Back pain, chest pressure, SOB and cough/RE   CC:  Back pain, chest pressure, and SOB and cough/RE.  History of Present Illness: here with acute osnet 3 days fever, HA, general weakness and malaise, with mild sT and now prod cough with greenish sputum, with mild SOB but no wheezing, but does have marked coughing , kept her up last night, and assoc with bilat mid back pain and anterilr CP only with ocughing, not exertional, nonpleuritic, sharp, mild to mod, not assoc with n/v, diaphoresis, palp or syncope. No sick contacts.  Has signfiant fatigue as well, as fearful she has pna. Overall chronic pain well controled, needs refill.  Denies worsening depressive symptoms, suicidal ideation, or panic. , though has ongoing anixety, not worse recently.  Pt denies new neuro symptoms such as headache, facial or extremity weakness  Pt denies polydipsia, polyuria.   Problems Prior to Update: 1)  Bronchitis-acute  (ICD-466.0) 2)  Sinusitis- Acute-nos  (ICD-461.9) 3)  Lumbar Radiculopathy, Left  (ICD-724.4) 4)  Back Pain  (ICD-724.5) 5)  Hepatotoxicity, Drug-induced, Risk of  (ICD-V58.69) 6)  Acute Pharyngitis  (ICD-462) 7)  Uri  (ICD-465.9) 8)  Hyperlipidemia  (ICD-272.4) 9)  Hypertension  (ICD-401.9) 10)  External Otitis  (ICD-380.10) 11)  Back Pain  (ICD-724.5) 12)  Pneumonia, Organism Nos  (ICD-486) 13)  Allergic Rhinitis  (ICD-477.9) 14)  Common Migraine  (ICD-346.10) 15)  Symptom, Palpitations  (ICD-785.1) 16)  Anxiety  (ICD-300.00) 17)  Asthma  (ICD-493.90) 18)  Pain, Chronic Nec  (ICD-338.29) 19)   Headache  (ICD-784.0) 20)  Depression  (ICD-311)  Medications Prior to Update: 1)  Alprazolam 0.5 Mg  Tabs (Alprazolam) .Marland Kitchen.. 1 By Mouth Once Daily As Needed 2)  Fluoxetine Hcl 40 Mg Caps (Fluoxetine Hcl) .Marland Kitchen.. 1 By Mouth Once Daily 3)  Oxycodone Hcl 5 Mg Tabs (Oxycodone Hcl) .Marland Kitchen.. 1 - 3 By Mouth Q 6 Hrs As Needed Pain 4)  Robaxin-750 750 Mg Tabs (Methocarbamol) .Marland Kitchen.. 1 By Mouth Two Times A Day As Needed 5)  Metoprolol Tartrate 50 Mg Tabs (Metoprolol Tartrate) .Marland Kitchen.. 1 By Mouth Two Times A Day 6)  Pravachol 20 Mg Tabs (Pravastatin Sodium) .Marland Kitchen.. 1 By Mouth Once Daily 7)  Doxycycline Hyclate 100 Mg Caps (Doxycycline Hyclate) .Marland Kitchen.. 1po Two Times A Day 8)  Fioricet 50-325-40 Mg Tabs (Butalbital-Apap-Caffeine) .Marland Kitchen.. 1po Once Daily As Needed 9)  Tessalon Perles 100 Mg Caps (Benzonatate) .Marland Kitchen.. 1-2 By Mouth Three Times A Day As Needed Cough  Current Medications (verified): 1)  Alprazolam 0.5 Mg  Tabs (Alprazolam) .Marland Kitchen.. 1 By Mouth Once Daily As Needed 2)  Fluoxetine Hcl 40 Mg Caps (Fluoxetine Hcl) .Marland Kitchen.. 1 By Mouth Once Daily 3)  Oxycodone Hcl 5 Mg Tabs (Oxycodone Hcl) .Marland Kitchen.. 1 - 3 By Mouth Q 6 Hrs As Needed Pain 4)  Robaxin-750 750 Mg Tabs (Methocarbamol) .Marland Kitchen.. 1 By Mouth Two Times A Day As Needed  5)  Metoprolol Tartrate 50 Mg Tabs (Metoprolol Tartrate) .Marland Kitchen.. 1 By Mouth Two Times A Day 6)  Pravachol 20 Mg Tabs (Pravastatin Sodium) .Marland Kitchen.. 1 By Mouth Once Daily 7)  Azithromycin 250 Mg Tabs (Azithromycin) .... 2po Qd For 1 Day, Then 1po Qd For 4days, Then Stop 8)  Fioricet 50-325-40 Mg Tabs (Butalbital-Apap-Caffeine) .Marland Kitchen.. 1po Once Daily As Needed 9)  Prednisone 10 Mg Tabs (Prednisone) .... 3po Qd For 3days, Then 2po Qd For 3days, Then 1po Qd For 3days, Then Stop  Allergies (verified): 1)  ! Sulfa 2)  ! Ecotrin Low Strength (Aspirin)  Past History:  Past Medical History: Last updated: 04/18/2009 Depression Headache Pain Asthma pneumonia 9/08 symptomatic palpitations Anxiety migraine Allergic  rhinitis Hypertension Hyperlipidemia recurrent LBP  Past Surgical History: Last updated: 10/02/2006 Caesarean section x2 Oophorectomy 1996/2000 Hysterectomy 1997 Appendectomy Tonsillectomy Left foot sugery 1997 Right Knee Surgery 2000 - Cartilage problem  Social History: Last updated: 03/05/2010 Current Smoker Alcohol use-yes Married Drug use-no  Risk Factors: Smoking Status: current (01/07/2007)  Review of Systems       all otherwise negative per pt -    Physical Exam  General:  alert and overweight-appearing.  , mild ill  Head:  normocephalic and atraumatic.   Eyes:  vision grossly intact, pupils equal, and pupils round.   Ears:  bilat tm's erythema, left > rigth without bulging and canals clear, sinus nontender bilat today Nose:  nasal dischargemucosal pallor and mucosal edema.   Mouth:  pharyngeal erythema and fair dentition.   Neck:  supple and cervical lymphadenopathy - tender Lungs:  normal respiratory effort, R decreased breath sounds, and L decreased breath sounds.  but no rales or wheezing Heart:  normal rate and regular rhythm.   Extremities:  no edema, no erythema  Psych:  not depressed appearing and moderately anxious.     Impression & Recommendations:  Problem # 1:  BRONCHITIS-ACUTE (ICD-466.0)  The following medications were removed from the medication list:    Tessalon Perles 100 Mg Caps (Benzonatate) .Marland Kitchen... 1-2 by mouth three times a day as needed cough Her updated medication list for this problem includes:    Azithromycin 250 Mg Tabs (Azithromycin) .Marland Kitchen... 2po qd for 1 day, then 1po qd for 4days, then stop  Orders: T-2 View CXR, Same Day (71020.5TC) treat as above, f/u any worsening signs or symptoms , cant r/o pna - for cxr today  Problem # 2:  BACK PAIN (ICD-724.5)  Her updated medication list for this problem includes:    Oxycodone Hcl 5 Mg Tabs (Oxycodone hcl) .Marland Kitchen... 1 - 3 by mouth q 6 hrs as needed pain    Robaxin-750 750 Mg Tabs  (Methocarbamol) .Marland Kitchen... 1 by mouth two times a day as needed    Fioricet 50-325-40 Mg Tabs (Butalbital-apap-caffeine) .Marland Kitchen... 1po once daily as needed and ant lower chest pain, only with coughing;  c/w MSK etiology,  Continue all previous medications as before this visit   Problem # 3:  HYPERTENSION (ICD-401.9)  Her updated medication list for this problem includes:    Metoprolol Tartrate 50 Mg Tabs (Metoprolol tartrate) .Marland Kitchen... 1 by mouth two times a day  BP today: 118/80 Prior BP: 138/80 (03/05/2010)  Labs Reviewed: K+: 4.0 (06/19/2006) Creat: : 0.8 (06/19/2006)   Chol: 189 (08/12/2008)   HDL: 44.00 (08/12/2008)   LDL: 118 (08/12/2008)   TG: 135.0 (08/12/2008) stable overall by hx and exam, ok to continue meds/tx as is   Complete Medication List: 1)  Alprazolam 0.5  Mg Tabs (Alprazolam) .Marland Kitchen.. 1 by mouth once daily as needed 2)  Fluoxetine Hcl 40 Mg Caps (Fluoxetine hcl) .Marland Kitchen.. 1 by mouth once daily 3)  Oxycodone Hcl 5 Mg Tabs (Oxycodone hcl) .Marland Kitchen.. 1 - 3 by mouth q 6 hrs as needed pain 4)  Robaxin-750 750 Mg Tabs (Methocarbamol) .Marland Kitchen.. 1 by mouth two times a day as needed 5)  Metoprolol Tartrate 50 Mg Tabs (Metoprolol tartrate) .Marland Kitchen.. 1 by mouth two times a day 6)  Pravachol 20 Mg Tabs (Pravastatin sodium) .Marland Kitchen.. 1 by mouth once daily 7)  Azithromycin 250 Mg Tabs (Azithromycin) .... 2po qd for 1 day, then 1po qd for 4days, then stop 8)  Fioricet 50-325-40 Mg Tabs (Butalbital-apap-caffeine) .Marland Kitchen.. 1po once daily as needed 9)  Prednisone 10 Mg Tabs (Prednisone) .... 3po qd for 3days, then 2po qd for 3days, then 1po qd for 3days, then stop  Patient Instructions: 1)  Please take all new medications as prescribed 2)  Continue all previous medications as before this visit  3)  You can also use Mucinex OTC or it's generic for congestion , and Delsym OTC for cough 4)  Please go to Radiology in the basement level for your X-Ray today  5)  Please call the number on the [facility] for results of your testing  6)   Please schedule a follow-up appointment as needed. Prescriptions: OXYCODONE HCL 5 MG TABS (OXYCODONE HCL) 1 - 3 by mouth q 6 hrs as needed pain  #120 x 0   Entered and Authorized by:   Corwin Levins MD   Signed by:   Corwin Levins MD on 06/04/2010   Method used:   Print then Give to Patient   RxID:   [id number] PREDNISONE 10 MG TABS (PREDNISONE) 3po qd for 3days, then 2po qd for 3days, then 1po qd for 3days, then stop  #18 x 0   Entered and Authorized by:   Corwin Levins MD   Signed by:   Corwin Levins MD on 06/04/2010   Method used:   Electronically to        [facility] [address] (retail)       [phone]       [place], [place]  [address]       Ph: [phone]       Fax: [phone]   RxID:   [id number] AZITHROMYCIN 250 MG TABS (AZITHROMYCIN) 2po qd for 1 day, then 1po qd for 4days, then stop  #6 x 1   Entered and Authorized by:   Corwin Levins MD   Signed by:   Corwin Levins MD on 06/04/2010   Method used:   Electronically to        [facility] [address] (retail)       [phone]       [place], [place]  [address]       Ph: [phone]       Fax: [phone]   RxID:   [id number]    Orders Added: 1)  T-2 View CXR, Same Day [71020.5TC] 2)  Est. Patient Level IV [69485]

## 2010-06-14 ENCOUNTER — Encounter: Payer: Self-pay | Admitting: Endocrinology

## 2010-06-14 ENCOUNTER — Ambulatory Visit (INDEPENDENT_AMBULATORY_CARE_PROVIDER_SITE_OTHER): Payer: Self-pay | Admitting: Endocrinology

## 2010-06-14 VITALS — BP 124/80 | HR 89 | Temp 98.9°F | Ht 71.0 in | Wt 257.4 lb

## 2010-06-14 DIAGNOSIS — J209 Acute bronchitis, unspecified: Secondary | ICD-10-CM

## 2010-06-14 MED ORDER — METHYLPREDNISOLONE 4 MG PO KIT: PACK | ORAL | Status: AC

## 2010-06-14 MED ORDER — CEFUROXIME AXETIL 250 MG PO TABS: 250.0000 mg | ORAL_TABLET | Freq: Two times a day (BID) | ORAL | Status: AC

## 2010-06-14 NOTE — Progress Notes (Signed)
  Subjective:    Patient ID: [name], female    DOB: Mar 07, 1961, 50 y.o.   MRN: [id number]  HPI 3 weeks of pain across the mid-posterior chest, and assoc slightly prod cough.  She reports trouble producing sputum.  mucinex has helped in the past, but not this time.   Past Medical History  Diagnosis Date  . DEPRESSION 10/02/2006  . ANXIETY 01/07/2007  . HYPERLIPIDEMIA 06/08/2008  . ASTHMA 10/02/2006  . SYMPTOM, PALPITATIONS 01/07/2007  . Headache 10/02/2006    Migrane  . PAIN, CHRONIC NEC 10/02/2006  . PNEUMONIA, ORGANISM NOS 01/07/2007  . Hypertension   . BACK PAIN 05/28/2007    Recurrent   Past Surgical History  Procedure Date  . Cesarean section     x2  . Abdominal hysterectomy     1997  . Appendectomy   . Oophorectomy 1996/2000  . Tonsillectomy   . Foot surgery     Left foot  . Knee surgery 2000    Right Knee-Cartilage Problem    reports that she has been smoking.  She does not have any smokeless tobacco history on file. She reports that she does not drink alcohol or use illicit drugs. family history includes Cancer in her mother and other. Allergies  Allergen Reactions  . Aspirin   . Sulfonamide Derivatives     Review of Systems She has doe and wheezing.  fever is resolved.      Objective:   Physical Exam Gen:  Obese, no distress Chest:  clear to auscultation.  no respiratory distress Chest wall:  nontender head: no deformity eyes: no periorbital swelling, no proptosis external nose and ears are normal mouth: no lesion seen Tm's are normal       Assessment & Plan:  Acute bronchitis, persistent.

## 2010-06-14 NOTE — Patient Instructions (Addendum)
here is a sample of "advair-250."  take 1 puff 2x a day.  rinse mouth after using.  Call if you need a prescription for this.   You can take from your old prescription of "benzonatate," prn for cough.   Your pain medication will also help the cough.   i have sent a prescription for steroids and another antibiotic to your pharmacy. Please call dr Jonny Ruiz in 2 weeks if you are not better.  Please call any time you have recurrence of your fever or shortness of breath.

## 2010-07-05 LAB — URINALYSIS, ROUTINE W REFLEX MICROSCOPIC
Bilirubin Urine: NEGATIVE
Glucose, UA: NEGATIVE mg/dL
Ketones, ur: NEGATIVE mg/dL
Nitrite: NEGATIVE
Protein, ur: NEGATIVE mg/dL
pH: 6.5 (ref 5.0–8.0)

## 2010-07-05 LAB — CARDIAC PANEL(CRET KIN+CKTOT+MB+TROPI)
CK, MB: 0.8 ng/mL (ref 0.3–4.0)
Relative Index: 0.6 (ref 0.0–2.5)
Total CK: 118 U/L (ref 7–177)
Total CK: 134 U/L (ref 7–177)
Troponin I: 0.01 ng/mL (ref 0.00–0.06)
Troponin I: <0.01 ng/mL (ref 0.00–0.06)

## 2010-07-05 LAB — DIFFERENTIAL
Lymphocytes Relative: 28 % (ref 12–46)
Lymphs Abs: 1.9 10*3/uL (ref 0.7–4.0)
Monocytes Relative: 8 % (ref 3–12)
Neutrophils Relative %: 62 % (ref 43–77)

## 2010-07-05 LAB — LIPID PANEL
Cholesterol: 179 mg/dL (ref 0–200)
HDL: 33 mg/dL — ABNORMAL LOW (ref >39)
Total CHOL/HDL Ratio: 5.4 RATIO
VLDL: 39 mg/dL (ref 0–40)

## 2010-07-05 LAB — POCT I-STAT, CHEM 8
BUN: 10 mg/dL (ref 6–23)
Chloride: 106 mEq/L (ref 96–112)
Creatinine, Ser: 1.1 mg/dL (ref 0.4–1.2)
Potassium: 3.7 mEq/L (ref 3.5–5.1)
Sodium: 142 mEq/L (ref 135–145)
TCO2: 28 mmol/L (ref 0–100)

## 2010-07-05 LAB — CK TOTAL AND CKMB (NOT AT ARMC): Relative Index: 1.4 (ref 0.0–2.5)

## 2010-07-05 LAB — CBC
HCT: 40.4 % (ref 36.0–46.0)
Hemoglobin: 14.1 g/dL (ref 12.0–15.0)
MCV: 94.4 fL (ref 78.0–100.0)
Platelets: 213 10*3/uL (ref 150–400)
WBC: 6.9 10*3/uL (ref 4.0–10.5)

## 2010-07-05 LAB — URINE MICROSCOPIC-ADD ON

## 2010-07-05 LAB — TROPONIN I: Troponin I: <0.01 ng/mL (ref 0.00–0.06)

## 2010-08-07 NOTE — Discharge Summary (Signed)
[name], [name]            ACCOUNT NO.:  [id number]   MEDICAL RECORD NO.:  [id number]          PATIENT TYPE:  INP   LOCATION:  2008                         FACILITY:  [facility]   PHYSICIAN:  Raenette Rover. [name], MDDATE OF BIRTH:  [birth year]   DATE OF ADMISSION:  05/24/2008  DATE OF DISCHARGE:  05/25/2008                               DISCHARGE SUMMARY   DISCHARGE DIAGNOSES:  1. Chest pain, rule out myocardial infarction.  No evidence of cardiac      abnormality with results of stress Myoview pending at the time of      dictation.  We will discharge home if negative.  2. Hypertension, previously on medications, to resume beta blocker at      this time.  3. History of dyslipidemia, not currently on medication, see details      below, outpatient followup.  4. Anxiety with depression, increased recent symptoms due to      situational stressors, to resume Prozac as previously prescribed.  5. Tobacco abuse, cessation advised and discussed.  6. Abnormal chest x-ray with stable right lower lobe nodule greater      than 12 months, reviewed with the patient.  No further workup or      treatment.   DISCHARGE MEDICATIONS:  1. Metoprolol 25 mg tablets 1/2 tablet p.o. b.i.d. or as directed.  2. Fluoxetine 20 mg p.o. daily   DISPOSITION:  The patient is discharged home.   FOLLOWUP:  With primary care physician, Dr. Oliver Stein scheduled for 2  weeks, Wednesday, June 08, 2008, at 11 a.m.   PROCEDURES THIS HOSPITALIZATION:  Adenosine Myoview at [facility] on  May 25, 2008, results pending at the time of dictation.   HOSPITAL COURSE:  Chest pain.  The patient is a 50 year old woman with  multiple risk factors for coronary artery disease including positive  family history, positive tobacco abuse, and positive hypertension with  dyslipidemia, not currently on treatment due to financial hardship, who  came to the emergency room on the day of admission due to complaints of  substernal  chest tightness.  She had no EKG changes, but was admitted to  telemetry for overnight observation.  She had no further recurrence of  the pain, and cardiac enzymes were cycled and negative for evidence of  ischemia.  She underwent an adenosine Myoview on the day of anticipated  discharge with results pending at this time, but will be discharged home  if there is no reversible ischemia or other significant abnormalities.  I have reviewed with the patient need to continue medical management of  her risk factors for coronary artery disease and encouraged her to  pursue the 4$-prescription plan as available at retail drug stores for  affordability for medications.  She agrees that she will do so and a  prescription has been provided and refill for her metoprolol and Prozac  as previously prescribed.  She is unable to take aspirin due to history  of severe allergic reaction and will also follow up with her primary  care physician on these chronic medical issues.  Also, regarding her  dyslipidemia, a fasting  lipid profile was checked during this  hospitalization, showed total cholesterol of 179, triglycerides of 193,  HDL suppressed to 33, and LDL mildly elevated at 107.  I have encouraged  her to continue with a low-fat, heart-healthy diet at this time for  efforts to medical management, hoping to avoid treatment, but also  asked that she follow up with her primary care physician on this in the  next several months.  Other medical issues are stable as listed above.  Cessation of tobacco was also discussed which the patient reports she is  in the process of doing so.  She is down half a pack a day at this time.  See chart for further details.      Valerie A. Tiffany Coyer, MD  Electronically Signed     VAL/MEDQ  D:  05/25/2008  T:  05/26/2008  Job:  [id number]

## 2010-08-10 NOTE — Op Note (Signed)
[facility]  Patient:    [name], [name] Visit Number: [id number] MRN: [id number]          Service Type: DSU Location: 9300 [id number] Attending Physician:  Rhina Brackett Dictated by:   Duke Salvia. Marcelle Overlie, M.D. Proc. Date: 04/23/01 Admit Date:  04/23/2001                             Operative Report  PREOPERATIVE DIAGNOSIS:       Chronic pelvic pain, recurrent left ovarian cyst.  POSTOPERATIVE DIAGNOSIS:      Bilateral adnexal adhesions, small functional appearing cyst on the left.  OPERATION:                    Diagnostic laparoscopy followed by laparotomy with bilateral salpingo-oophorectomy.  SURGEON:                      Duke Salvia. Marcelle Overlie, M.D.  ANESTHESIA:                   General endotracheal.  COMPLICATIONS:                None.  DRAINS:                       Foley catheter.  ESTIMATED BLOOD LOSS:         100.  PROCEDURE AND FINDINGS:       Patient went to the operating room after an adequate level of general endotracheal anesthesia was obtained.  With the legs in stirrups and abdomen, perineum, and vagina were prepped and draped in the usual manner for laparoscopy.  The bladder was drained.  EUA carried out.  No masses were noted.  Attention directed to the abdomen where a 2 cm subumbilical incision was made.  The Veress needle was introduced without difficulty.  Its intra-abdominal position was verified by pressure and water testing.  After a 2 L pneumoperitoneum was created laparoscopic trocar and sleeve were then introduced without difficulty.  There was no evidence of any bleeding or trauma.  No unusual omental or bowel adhesions into the anterior abdominal wall.  Three fingerbreadths above the symphysis in the midline a 5 mm trocar was inserted under direct visualization.  Patient then placed in Trendelenburg.  Pelvic findings as follows.  The left ovary was approximately 3 x 4 cm with two or three small 2  cm functional appearing cysts.  There were also dense adhesions to the pelvic side wall limiting its mobility.  The cul-de-sac was free and clear, although she was reported to have an RSO, a remnant, or 50% portion of the right ovary was still noted.  Decision made to proceed with laparotomy and BSO.  The patient was adequately prepped previously.  The legs were placed supine. Foley catheter was positioned.  Pfannenstiel made three fingerbreadths above the symphysis, carried down to the fascia which was incised and extended transversely.  Rectus muscles divided in the midline.  Peritoneum entered superiorly without incident.  The Balfour retractor was positioned.  The bowel was packed superiorly out of the field.  The right ovary was grasped with Babcock clamps.  The right round ligament was clamped with Kelly clamp, divided, suture ligated with 0 Dexon held temporarily.  The retroperitoneal space on that side was developed.  The right IP ligament was then isolated. The course of the right  ureter was traced out and noted to be well below.  The right IP ligament was then isolated, clamped, divided, first free tied followed by a suture ligature of 0 Dexon.  The remainder of the right tube and ovary was then clamped, divided, and suture ligated.  The course of the ureter was reinspected and noted to be well below the operative site.  This area was hemostatic.  Starting on the left the round ligament was clamped, divided, and suture ligated.  Retroperitoneal space was developed.  The ovary was mobilized with blunt dissection.  The course of the left ureter was traced and noted to be well below the operative site.  Left IP ligament was isolated, clamped, divided, doubly free tied with 0 Dexon suture.  Remainder of the left tube and ovary were dissected by clamp, cut, and tie technique.  This area was then inspected.  Again, the course of the ureter was well below.  All pedicles were hemostatic.   Pelvis was irrigated with saline and aspirated, noted to be hemostatic.  Prior to closure sponge, needle, and instrument counts reported are correct x2.  Rectus muscles reapproximated with 2-0 Dexon interrupted suture.  Fascia closed from laterally to midline on either side with a 0 PDS suture.  Subcutaneous fat was hemostatic.  Clips and Steri-Strips were on skin.  She tolerated this well.  Went to recovery room in good condition.  The umbilical incision was closed with a 4-0 Dexon subcuticular suture.  Clear urine noted at the end of the case. Dictated by:   Duke Salvia. Marcelle Overlie, M.D. Attending Physician:  Rhina Brackett DD:  04/23/01 TD:  04/23/01 Job: [id number]

## 2010-08-10 NOTE — Op Note (Signed)
NAME:  [name], [name]                      ACCOUNT NO.:  [id number]   MEDICAL RECORD NO.:  [id number]                   PATIENT TYPE:  INP   LOCATION:  3029                                 FACILITY:  [facility]   PHYSICIAN:  Stefani Dama, M.D.               DATE OF BIRTH:  [birth year]   DATE OF PROCEDURE:  11/18/2002  DATE OF DISCHARGE:                                 OPERATIVE REPORT   PREOPERATIVE DIAGNOSIS:  Herniated nucleus pulposus, C4-5, with left  cervical radiculopathy.   POSTOPERATIVE DIAGNOSIS:  Herniated nucleus pulposus, C4-5, with left  cervical radiculopathy.   OPERATION:  Anterior cervical diskectomy, arthrodesis with structural  allograft, and Synthes fixation, C4-5.   SURGEON:  Stefani Dama, M.D.   FIRST ASSISTANT:  Clydene Fake, M.D.   ANESTHESIA:  General endotracheal.   INDICATIONS:  The patient is a 50 year old individual who has had  significant neck, shoulder, and left arm pain with weakness in the deltoid.  She has a herniated nucleus pulposus at C4-5.  She is taken to the operating  room.   DESCRIPTION OF PROCEDURE:  The patient was brought to the operating room  supine on the stretcher.  After the smooth induction of general endotracheal  anesthesia, she was placed in five pounds of Holter traction.  The neck was  shaved, prepped with Duraprep, and draped in a sterile fashion.  A  transverse incision was made in the left side of the neck and carried down  through the platysma.  The plane between the sternocleidomastoid and the  strap muscles was dissected bluntly until the prevertebral space was  reached.  The first identifiable disk space was noted to be that of C5-6.  Dissection was carried slightly cephalad to expose the C4-5 disk space,  which had some ventral osteophyte.  When this area was cleared laterally,  the self-retaining Caspar retractor was placed in the wound under the longus  colli muscle and diskectomy was then performed  using a combination of  curettes and rongeurs to remove a significant quantity of severely  degenerated disk material.  As the posterior longitudinal ligament was  reached on the left side, there was noted to be a significant tearing of the  posterior longitudinal ligament from the posteriormost aspect of the  vertebral body, and there was some subligamentous material in this space.  This was cleared and the ligament was then taken up to decompress the  central canal out to either side, particularly on the left.  Complete  decompression was performed to decompress the left outgoing C5 nerve root.  Once this was accomplished, osteophytic spurs were drilled down adequately  with a 2.3 mm dissecting tool.  Hemostasis in the soft tissues was achieved.  Then a 7 mm lordotic bone graft was fashioned and placed into the interspace  with some of the patient's own bone chips that had been harvested from the  lateral dissection  being placed into the graft.  The traction was removed.  The area was irrigated again for checking of the hemostasis and then a 16 mm  standard-size Synthes plate was affixed with four 4 x 4 screws.  On the  right side the screw had stripped on the upper hole, and this required  replacement with a 4.5 mm cancellous screw.  The fit was tight, and the  radiographic appearance of the construct  was quite adequate.  The area was then checked for hemostasis in the soft  tissues, and then the platysma was closed with 3-0 Vicryl in interrupted  fashion.  Vicryl 3-0 was used to close the subcuticular skin.  The patient  tolerated the procedure well.                                               Stefani Dama, M.D.    Merla Riches  D:  11/18/2002  T:  11/19/2002  Job:  [id number]

## 2010-08-10 NOTE — Discharge Summary (Signed)
[place]  Patient:    [name], [name]                   MRN: [id number] Adm. Date:  [id number] Disc. Date: [id number] Attending:  Justine Null Dictator:   Cornell Barman, P.A.                           Discharge Summary  DISCHARGE DIAGNOSIS:  Atypical chest pain, myocardial infarction ruled out.  BRIEF ADMISSION HISTORY:  [name] is a 50 year old white female with two days of intermittent chest pain, described as left chest pain and tightness since Sunday.  Duration was 20 minute intervals.  During the night Sunday, she had mild dyspnea and diaphoresis without chest tightness.  Chest tightness recurred on Monday morning and was associated with nausea.  The chest tightness was nonexertional.  She did have some relief with Xanax. Since that time, the patients pain has been somewhat constant, although remitting at times.  She states currently the pain has intensified and radiates from her left breast straight through to her back.  She is not clear but thinks perhaps she has some radiation to the left jaw.  She does have some mild dyspnea associated.  She did have significant relief with sublingual nitroglycerin; however, the pain recurred, and a nitroglycerin drip was started, and again her pain seemed to ease.  The patient now describes feeling anxious.  Cardiac Risk Factors:  No diabetes, no hypertension.  Positive tobacco.  No hypercholesterolemia.  Positive family history.  Apparently her mother was deceased at 72 with onset of heart disease at age 42.  She is not able to specify a type of heart disease.  She also describes her maternal grandmother having heart disease at a young age as well.  She does have a positive family history for diabetes and coronary artery disease in several uncles.  PAST MEDICAL HISTORY: 1. Mild asthma. 2. Status post hysterectomy secondary to fibroid tumors.  Ovaries are still    intact. 3. Status  post appendectomy. 4. History of bowel obstruction and surgery at age 39 secondary to adhesions    for appendicitis. 5. Status post C section x 2. 6. Status post ovarian cyst removal x 3 to 4.  LABORATORY DATA ON ADMISSION:  Cardiac enzymes were negative.  A 12-lead EKG revealed normal sinus rhythm.  HOSPITAL COURSE:  CHEST PAIN:  The patient was admitted.  Cardiac enzymes were negative x 3. EKG was without ischemia.  The patient underwent a treadmill Cardiolite which was negative for ischemia.  This revealed an EF of 68%.  The patients nitroglycerin drip was discontinued when her enzymes were negative.  She did have some brief recurrent pain that was relieved with Toradol.  The patient has been started on Vioxx, and we will have the patient complete seven days of Vioxx for possible chest wall inflammation.  The patient has also empirically been started on Protonix for a one-month trial.  DISCHARGE MEDICATIONS: 1. Protonix 40 mg q.d. x 1 month. 2. Vioxx 25 mg q.d for 7 days.  FOLLOWUP:  With Dr. Jonny Ruiz next week. DD:  11/07/99 TD:  11/08/99 Job: [id number]

## 2010-08-10 NOTE — Consult Note (Signed)
NAME:  [name], [name]                      ACCOUNT NO.:  [id number]   MEDICAL RECORD NO.:  [id number]                   PATIENT TYPE:  INP   LOCATION:  3029                                 FACILITY:  [facility]   PHYSICIAN:  Stefani Dama, M.D.               DATE OF BIRTH:  1960-09-25   DATE OF CONSULTATION:  11/17/2002  DATE OF DISCHARGE:                                   CONSULTATION   REFERRING PHYSICIAN:  Rene Paci, M.D.   REASON FOR CONSULTATION:  Herniated nucleus pulposus, C4-5 and left arm  pain.   HISTORY OF PRESENT ILLNESS:  The patient is a 50 year old right-handed  individual who notes that on Sunday morning, she woke up and started to  develop severe and rather excruciating shoulder and arm pain.  Later in the  morning she notes that her arm felt completely numb and she felt that it was  essentially paralyzed.  She also noted that she felt there was a swelling.  She was seen in the emergency department and admitted to the hospital,  ultimately to have myocardial infarction ruled out and also the possibility  of upper venous thrombosis ruled out.  These are both ruled out with a  negative Doppler of the upper extremity.  The patient complained of  significant neck, shoulder and arm pain in that left side and ultimately  underwent an MRI of the neck which demonstrated the presence of a prominent  disk herniation at C4-5, eccentric to the left with effacement of the  ventral aspect of the spinal cord and some fullness within the region of  foramen.  Since that time she has been having continued pain in the neck,  shoulder and the left arm.  She feels that the arm pain is excruciating and  she is having a difficult time dealing with it.  She has been maintained on  some anticoagulation in the form of Lovenox daily.   PAST MEDICAL HISTORY:  Reveals that she has had problems with low back pain  in the past and was told that she had degenerative disk disease.  She  has  had some recent onset of depression and has been on Prozac.  She denies any  other significant medical problems.  She notes that her general health has  been very good overall.  She reports no significant surgical intervention.   REVIEW OF SYSTEMS:  Negative except for the items in the history of present  illness, notably the left shoulder and left arm pain.   FAMILY HISTORY:  Negative for any contributing medical illnesses.   PHYSICAL EXAMINATION:  VITAL SIGNS:  Blood pressure 120/78, heart rate 66  and regular, respirations 18 and unlabored.  GENERAL:  She is alert, oriented and cooperative, though she tends to her  left arm in a cupped position and is rather protective of it.  NEUROLOGIC:  She demonstrates strength in the deltoid that is graded  a 4/5  in the left deltoid, 4/5 in the grip and 5/5 in the biceps.  Intrinsic  strength appears normal.  Triceps strength appears normal.  There is some  overlay of pain in the region of the shoulder.  There is no tenderness to  palpation in and around the area of the rotator cuff.  However, there is  tenderness to palpation in the supraclavicular fossa on the left.  Spurling  maneuver on the left side is positive.  Rotation to the right is within the  limits of normal.  Axial compression has reproduced some pain.  Sensation  reveals intact pin and vibratory sensation of the distal upper extremities.  Reflexes are 2+ biceps, 2+ triceps, 2+ in the patellae, 1+ Achilles.  Babinski's are downgoing.  Sensation is intact.   IMPRESSION:  The patient has evidence of herniated nucleus pulposus at C4-5  which is rather prominent.  I reviewed the study with the radiologist and  hope that the axial images do not demonstrate the true size of the disk  herniation.  It is best seen on the sagittal images.  The patient has been  feeling poorly now for three days and I indicated that because of her slight  weakness and the size of the disk herniation,  she may want to consider early  surgical extirpation.  I indicated that this is not emergent and if pain  medication is working, then she could defer surgical intervention and allow  the pain medication to have it effect and see if this condition does not  calm down on its own.  She notes that she has been feeling poorly and does  not wish to wait any significant length of time and would like to proceed  with surgery early if this will give her some relief.  I indicated that we  could consider surgical intervention tomorrow; however, she would like to  discuss this with her husband before proceeding.  I advised her at current  time that we should stop her Lovenox and will make her NPO after midnight  until she makes a decision.  Otherwise I can follow her up as an outpatient  and she can continue with conservative measurement including the use of a  soft cervical collar and narcotic analgesics for severe pain, nonsteroidal  anti-inflammatories as needed to control minimal pain.                                               Stefani Dama, M.D.    Merla Riches  D:  11/17/2002  T:  11/18/2002  Job:  [id number]

## 2010-08-10 NOTE — H&P (Signed)
NAME:  [name], [name]                      ACCOUNT NO.:  [id number]   MEDICAL RECORD NO.:  [id number]                   PATIENT TYPE:  INP   LOCATION:  0460                                 FACILITY[facility]  [facility]   PHYSICIAN:  Corwin Levins, M.D. [facility]             DATE OF BIRTH:  1961/01/27   DATE OF ADMISSION:  11/15/2002  DATE OF DISCHARGE:                                HISTORY & PHYSICAL   CHIEF COMPLAINT:  Here with new onset left-sided neck, shoulder, and arm  discomfort, as well as swelling for the past two days.   HISTORY OF PRESENT ILLNESS:  [name] is a 50 year old white female  here with the above.  Pain originally seemed to start about the base of the  left neck, spread to the top of the shoulder, and now associated with some  swelling involved in the whole left upper extremity.  It was a bit worse  yesterday as far as swelling.  Today it is a bit better, but still clearly  abnormal it seems.  There is some numbness and possible weakness of the  distal lower extremity suggesting radicular symptoms as well.   PAST MEDICAL HISTORY:  1. Hypertension.  2. Obesity.  3. Hypercholesterolemia.  4. Allergies.  5. Anxiety/depression.  6. Status post TAH/BSO.   SOCIAL HISTORY:  Tobacco:  Less then one pack per day.  Alcohol:  Occasional.   FAMILY HISTORY:  Significant for liver cancer and ovary cancer.   REVIEW OF SYSTEMS:  Otherwise noncontributory.  There are no cardiovascular  symptoms such as shortness of breath or chest discomfort at all.   PHYSICAL EXAMINATION:  GENERAL:  [name] is a 50 year old white  female.  VITAL SIGNS:  Blood pressure 100/60, respirations 20, pulse 76, temperature  98.4, weight 199.  HEENT:  Sclerae clear.  TM's clear.  Oropharynx benign.  NECK:  Without lymphadenopathy or JVD.  CHEST:  No rales, wheezes.  CARDIAC:  Regular rate and rhythm.  No murmur.  ABDOMEN:  Soft, nontender, positive bowel sounds.  No organomegaly, no  masses.  EXTREMITIES:  No edema.  NEUROLOGIC:  Cranial nerves II-XII intact.  Otherwise nonfocal.  SKIN:  No significant lesion.  There is some tenderness to the base of the  left neck, trapezius bridge, and diffuse swelling to the left upper  extremity.  There also seems to be some warmth, possible erythema of the  biceps and triceps area, though this is somewhat similar to the right upper  extremity.  The right upper extremity may not be that much worse.  There is  some clear weakness to the distal left upper extremity with a 4/5 grip.   ASSESSMENT AND PLAN:  1. Left upper extremity swelling with neck and shoulder pain.  Questionable     deep vein thrombosis versus radicular pain.  She is to be admitted.  We     will check routine labs, chest x-ray,  and coag's.  Start therapeutic     Lovenox for now.  Also check left upper extremity venous Doppler to rule     out deep vein thrombosis.  If negative, she will need C-spine MRI to rule     out C-spine disk disease, herniation.  She will also need medications for     pain control at this point.  2. Hypertension.  Continue the Toprol as she is otherwise stable.  3. Anxiety/depression.  She had been off her Prozac recently, but asked to     be put back on at this time.                                               Corwin Levins, M.D. [facility]    JWJ/MEDQ  D:  11/15/2002  T:  11/15/2002  Job:  [id number]

## 2010-08-10 NOTE — Discharge Summary (Signed)
   NAME:  [name], [name]                      ACCOUNT NO.:  [id number]   MEDICAL RECORD NO.:  [id number]                   PATIENT TYPE:  INP   LOCATION:  3029                                 FACILITY:  [facility]   PHYSICIAN:  Stefani Dama, M.D.               DATE OF BIRTH:  05-06-1960   DATE OF ADMISSION:  11/18/2002  DATE OF DISCHARGE:  11/19/2002                                 DISCHARGE SUMMARY   ADMISSION DIAGNOSIS:  Neck, shoulder, and left arm pain secondary to  herniated nucleus pulposis, C4-5, left.   DISCHARGE DIAGNOSIS:  Neck, shoulder, and left arm pain secondary to  herniated nucleus pulposis, C4-5, left.   OPERATION:  Anterior cervical diskectomy and arthrodesis with structural  allograft C4-5 on November 18, 2002.   HOSPITAL COURSE:  The patient is a 50 year old individual who was admitted  on November 15, 2002, with complaints of neck, shoulder, and left arm pain.  She had a cardiac workup which proved to be negative and did not show any  evidence of cardiac incident.  The patient was started on heparin, fearing  that she may of had deep vein thrombosis of the left upper extremity.  An  MRI was also performed which demonstrated the presence of a herniated  nucleus pulposis at C4-5 eccentric to the left side.  I was consulted on  November 17, 2002.  I advised that she would require surgical decompression  and arthrodesis as she had significant weakness in the deltoid.  This was  performed on November 18, 2002.  Postoperatively, the patient had some nausea  and vomiting, however, this has resolved in the 24 hours since her surgery.  Her incision is clean and dry, and she is swallowing fairly well.  She is  discharged home with a prescription for Percocet #40 without refills, Valium  5 mg #20 without refills.  I will see her in the office in two weeks' time  for follow up.   CONDITION ON DISCHARGE:  Improving.                                                Stefani Dama, M.D.    Merla Riches  D:  11/19/2002  T:  11/20/2002  Job:  [id number]

## 2010-08-10 NOTE — Discharge Summary (Signed)
[facility]  Patient:    CLAUDE, SWENDSEN Visit Number: [id number] MRN: [id number]          Service Type: DSU Location: [id number] Attending Physician:  Rhina Brackett Dictated by:   Duke Salvia. Marcelle Overlie, M.D. Admit Date:  04/23/2001 Discharge Date: 04/26/2001                             Discharge Summary  DISCHARGE DIAGNOSES: 1. Chronic pelvic pain. 2. Bilateral adnexal adhesions. 3. Laparoscopy followed by bilateral salpingo-oophorectomy this admission.  HISTORY OF PRESENT ILLNESS:  Please see admission H&P for details.  Briefly, a 50 year old who has had a previous total vaginal hysterectomy and right salpingo-oophorectomy with persistent left lower quadrant pain and an ovarian cyst that looked simple that has been persisted, noted on ultrasound exam, presents now for USO.  HOSPITAL COURSE:  On 04/23/01, under general anesthesia, the patient underwent laparoscopy showing dense left adnexal adhesions, and also the presence of a right ovary that looked fairly normal.  This may be representative of remnant from her prior supposed RSO.  Due to the adhesions surrounding the left ovary, decision made to proceed with laparotomy and bilateral salpingo-oophorectomy which was performed without complication.  On 04/24/01, postoperative day #1, her hemoglobin was 10.8, preoperative had been 13.7.  She was tolerating clear liquids at that point and remained afebrile.  The second postoperative day, 04/25/01, had a temperature max of 100.2, was complaining of some gas pain, but was passing flatus at that point.  Received a Dulcolax suppository.  Followup CBC showed a hemoglobin of 11.2, white blood cell count 8900 on 04/25/01.  By 04/26/01, a.m., she was tolerating a regular diet, was still complaining of some gas pain, received Fleets enema before discharge, but is not having any problems with nausea, vomiting, and was having passage of spontaneous  flatus. Clips were removed and Steri-Strips applied at that point.  LABORATORY DATA:  Preoperative hemoglobin 13.7, postoperative on 04/25/01, was 11.2.  Admission urinalysis was negative.  Pathology report is still pending.  DISCHARGE MEDICATIONS:  Tylox p.r.n. pain.  FOLLOWUP:  Will return to the office in 3 to 4 days.  DISCHARGE INSTRUCTIONS:  The clips have been removed.  I advised her to report any incisional redness or drainage, increased pain or bleeding, or fever over 101.  She was advised to take stool softeners daily, simethicone tablets as needed for gas, along with warm liquids and ambulation.  To call if she has increasing abdominal pain, constipation, or persistent nausea or vomiting. Specific instructions also given regarding diet, sex, and exercise.  CONDITION ON DISCHARGE:  Good. Dictated by:   Duke Salvia. Marcelle Overlie, M.D. Attending Physician:  Rhina Brackett DD:  04/26/01 TD:  04/27/01 Job: [id number]

## 2010-10-15 ENCOUNTER — Encounter: Payer: Self-pay | Admitting: Internal Medicine

## 2010-10-15 DIAGNOSIS — Z0000 Encounter for general adult medical examination without abnormal findings: Secondary | ICD-10-CM

## 2010-10-15 DIAGNOSIS — Z0001 Encounter for general adult medical examination with abnormal findings: Secondary | ICD-10-CM | POA: Insufficient documentation

## 2010-10-16 ENCOUNTER — Encounter: Payer: Self-pay | Admitting: Internal Medicine

## 2010-10-16 ENCOUNTER — Ambulatory Visit (INDEPENDENT_AMBULATORY_CARE_PROVIDER_SITE_OTHER): Payer: Self-pay | Admitting: Internal Medicine

## 2010-10-16 VITALS — BP 130/78 | HR 86 | Temp 98.5°F | Ht 70.0 in | Wt 261.0 lb

## 2010-10-16 DIAGNOSIS — IMO0002 Reserved for concepts with insufficient information to code with codable children: Secondary | ICD-10-CM

## 2010-10-16 DIAGNOSIS — G8929 Other chronic pain: Secondary | ICD-10-CM

## 2010-10-16 DIAGNOSIS — I10 Essential (primary) hypertension: Secondary | ICD-10-CM

## 2010-10-16 MED ORDER — BUTALBITAL-APAP-CAFFEINE 50-325-40 MG PO TABS: 1.0000 | ORAL_TABLET | Freq: Every day | ORAL | Status: DC | PRN

## 2010-10-16 MED ORDER — DULOXETINE HCL 60 MG PO CPEP: 60.0000 mg | ORAL_CAPSULE | Freq: Every day | ORAL | Status: DC

## 2010-10-16 MED ORDER — CYCLOBENZAPRINE HCL 5 MG PO TABS: 5.0000 mg | ORAL_TABLET | Freq: Three times a day (TID) | ORAL | Status: AC | PRN

## 2010-10-16 MED ORDER — HYDROCODONE-ACETAMINOPHEN 10-325 MG PO TABS: 1.0000 | ORAL_TABLET | Freq: Four times a day (QID) | ORAL | Status: AC | PRN

## 2010-10-16 NOTE — Assessment & Plan Note (Signed)
stable overall by hx and exam, most recent data reviewed with pt, and pt to continue medical treatment as before  BP Readings from Last 3 Encounters:  10/16/10 130/78  06/14/10 124/80  06/04/10 118/80

## 2010-10-16 NOTE — Assessment & Plan Note (Addendum)
Increased severity acute on chronic, exam stable, cannot take oxycodone due to sleepiness;  Ok to change to hydrocodone, add flexeril prn, to f/u NS as [employer], also refer pain clinic, and add cymbalta if can afford

## 2010-10-16 NOTE — Patient Instructions (Signed)
Take all new medications as prescribed - the hydrocodone, flexeril, and cymbalta Remember, the cymbalta is started at 30 mg per day for 7 days, then 60 mg per day after that Please wean off the prozac 40 mg by taking half for 2 wks, then stop You will be contacted regarding the referral for: Neurosurgury at [facility], as well as Pain clinic

## 2010-10-16 NOTE — Assessment & Plan Note (Signed)
See also discussion today for left lumbar radiculopathy;  Also for pain clinic referral if affordable

## 2010-10-16 NOTE — Progress Notes (Signed)
Subjective:    Patient ID: [name], female    DOB: 10/28/1960, 50 y.o.   MRN: [id number]  HPI  Here with mult arthralgias, cant hold phone to the left ear without crying after 5 min, increased stiffness to the neck for approx 2 mo, increased overall back pain,  But also 6-8 wks increased but no bowel or bladder change, fever, wt loss,   But has had left side worsening LE pain to the buttock and Left thigh, with occasional numbness/weakness - occurs with walking with a cart at the store - occas the left leg will drag.  Did fall once 2 mo ago at home and broke glasses - fell on face and head.  Also fell 3 wks ago with folding clothes as she bent upwards, lost balance, went back, hit a door and then the floor.  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  A friend let her borrow a TENS unit recetnly, did not help.  A friend has FMS and asked her to bring it up today. Has not tried cymbalta or lyrica in the past.  Has been at [facility] with MRI showing some lumbar DJD and DDD; prob done < 1 yr per pt, did not recommend surgury - disc dz not bad enough;  Did have cortisone ESI done last about late 2004 to lumbar only, did get some relief at that time.   Has not seen pain clinic.  Has [facility].  Overal frustrated over ongoing pain now for at least 1 yr.  Does not want to take the roxicodone due to sleepiness . Past Medical History  Diagnosis Date  . DEPRESSION 10/02/2006  . ANXIETY 01/07/2007  . HYPERLIPIDEMIA 06/08/2008  . ASTHMA 10/02/2006  . SYMPTOM, PALPITATIONS 01/07/2007  . Headache 10/02/2006    Migrane  . PAIN, CHRONIC NEC 10/02/2006  . PNEUMONIA, ORGANISM NOS 01/07/2007  . Hypertension   . BACK PAIN 05/28/2007    Recurrent   Past Surgical History  Procedure Date  . Cesarean section     x2  . Abdominal hysterectomy     1997  .  Appendectomy   . Oophorectomy 1996/2000  . Tonsillectomy   . Foot surgery     Left foot  . Knee surgery 2000    Right Knee-Cartilage Problem    reports that she has been smoking.  She does not have any smokeless tobacco history on file. She reports that she does not drink alcohol or use illicit drugs. family history includes Cancer in her mother and other. Allergies  Allergen Reactions  . Aspirin   . Sulfonamide Derivatives    Current Outpatient Prescriptions on File Prior to Visit  Medication Sig Dispense Refill  . ALPRAZolam (XANAX) 0.5 MG tablet Take 0.5 mg by mouth daily as needed.        . butalbital-acetaminophen-caffeine (FIORICET) 50-325-40 MG per tablet Take 1 tablet by mouth daily as needed.        Marland Kitchen FLUoxetine (PROZAC) 40 MG capsule Take 40 mg by mouth daily.        . methocarbamol (ROBAXIN) 750 MG tablet Take 750 mg by mouth 2 (two) times daily as needed.        . metoprolol (LOPRESSOR) 50 MG tablet Take 50 mg by mouth 2 (two) times daily.        Marland Kitchen  oxyCODONE (OXY IR/ROXICODONE) 5 MG immediate release tablet 1-3 by mouth every 6 hrs as needed for pain       . pravastatin (PRAVACHOL) 20 MG tablet Take 20 mg by mouth daily.         Review of Systems Review of Systems  Constitutional: Negative for diaphoresis and unexpected weight change.  HENT: Negative for drooling and tinnitus.   Eyes: Negative for photophobia and visual disturbance.  Respiratory: Negative for choking and stridor.   Gastrointestinal: Negative for vomiting and blood in stool.  Genitourinary: Negative for hematuria and decreased urine volume.       Objective:   Physical Exam BP 130/78  Pulse 86  Temp(Src) 98.5 F (36.9 C) (Oral)  Ht 5\' 10"  (1.778 m)  Wt 261 lb (118.389 kg)  BMI 37.45 kg/m2  SpO2 97% Physical Exam  VS noted, obese Constitutional: Pt appears well-developed and well-nourished.  HENT: Head: Normocephalic.  Right Ear: External ear normal.  Left Ear: External ear normal.  Eyes:  Conjunctivae and EOM are normal. Pupils are equal, round, and reactive to light.  Neck: Normal range of motion. Neck supple.  Cardiovascular: Normal rate and regular rhythm.   Pulmonary/Chest: Effort normal and breath sounds normal.  Abd:  Soft, NT, non-distended, + BS Neurological: Pt is alert. No cranial nerve deficit. LE motor/dtr intact, gait stiff Skin: Skin is warm. No erythema.  Psychiatric: Pt behavior is normal. Thought content normal. 2+ nervous Spine tender low cervical, mid thoracic and low lumbar with bilat upper thoracic/trapezius and lumbar paravertebral tender/spasm as well       Assessment & Plan:

## 2011-02-19 ENCOUNTER — Other Ambulatory Visit: Payer: Self-pay | Admitting: Internal Medicine

## 2011-02-19 DIAGNOSIS — G8929 Other chronic pain: Secondary | ICD-10-CM

## 2011-02-19 MED ORDER — BUTALBITAL-APAP-CAFFEINE 50-325-40 MG PO TABS: 1.0000 | ORAL_TABLET | Freq: Every day | ORAL | Status: DC | PRN

## 2011-02-19 NOTE — Telephone Encounter (Signed)
Done per pt request 

## 2011-02-26 ENCOUNTER — Ambulatory Visit (INDEPENDENT_AMBULATORY_CARE_PROVIDER_SITE_OTHER): Payer: Self-pay | Admitting: Internal Medicine

## 2011-02-26 ENCOUNTER — Encounter: Payer: Self-pay | Admitting: Internal Medicine

## 2011-02-26 DIAGNOSIS — R062 Wheezing: Secondary | ICD-10-CM

## 2011-02-26 DIAGNOSIS — J209 Acute bronchitis, unspecified: Secondary | ICD-10-CM

## 2011-02-26 MED ORDER — CEPHALEXIN 500 MG PO CAPS
500.0000 mg | ORAL_CAPSULE | Freq: Four times a day (QID) | ORAL | Status: AC
Start: 2011-02-26 — End: 2011-03-08

## 2011-02-26 MED ORDER — HYDROCODONE-HOMATROPINE 5-1.5 MG/5ML PO SYRP: 5.0000 mL | ORAL_SOLUTION | Freq: Four times a day (QID) | ORAL | Status: AC | PRN

## 2011-02-26 MED ORDER — PREDNISONE 10 MG PO TABS: 10.0000 mg | ORAL_TABLET | Freq: Every day | ORAL | Status: AC

## 2011-02-26 MED ORDER — METHYLPREDNISOLONE ACETATE 80 MG/ML IJ SUSP
120.0000 mg | Freq: Once | INTRAMUSCULAR | Status: AC
  Administered 2011-02-26: 120 mg via INTRAMUSCULAR

## 2011-02-26 NOTE — Patient Instructions (Signed)
You had the steroid shot today Take all new medications as prescribed - the antibiotic, cough medicine, and prednisone Continue all other medications as before You can also take Mucinex (or it's generic off brand) for congestion You are given the Ventolin HFA sample today for as needed use You are also given the Pulmicort Inhaler to use 2 puffs twice per day for 10 days (then ok to stop) Remember to rinse mouth with water after pulmicort use to avoid thrush

## 2011-02-26 NOTE — Progress Notes (Signed)
  Subjective:    Patient ID: [name], female    DOB: 1960-05-12, 50 y.o.   MRN: [id number]  HPI Here with acute onset mild to mod 2-3 days ST, HA, general weakness and malaise, with prod cough greenish sputum, but Pt denies chest pain, increased sob or doe, wheezing, orthopnea, PND, increased LE swelling, palpitations, dizziness or syncopel except for midl wheeze starting this am Past Medical History  Diagnosis Date  . DEPRESSION 10/02/2006  . ANXIETY 01/07/2007  . HYPERLIPIDEMIA 06/08/2008  . ASTHMA 10/02/2006  . SYMPTOM, PALPITATIONS 01/07/2007  . Headache 10/02/2006    Migrane  . PAIN, CHRONIC NEC 10/02/2006  . PNEUMONIA, ORGANISM NOS 01/07/2007  . Hypertension   . BACK PAIN 05/28/2007    Recurrent   Past Surgical History  Procedure Date  . Cesarean section     x2  . Abdominal hysterectomy     1997  . Appendectomy   . Oophorectomy 1996/2000  . Tonsillectomy   . Foot surgery     Left foot  . Knee surgery 2000    Right Knee-Cartilage Problem    reports that she has been smoking.  She does not have any smokeless tobacco history on file. She reports that she does not drink alcohol or use illicit drugs. family history includes Cancer in her mother and other. Allergies  Allergen Reactions  . Aspirin   . Sulfonamide Derivatives    Current Outpatient Prescriptions on File Prior to Visit  Medication Sig Dispense Refill  . ALPRAZolam (XANAX) 0.5 MG tablet Take 0.5 mg by mouth daily as needed.        . butalbital-acetaminophen-caffeine (FIORICET) 50-325-40 MG per tablet Take 1 tablet by mouth daily as needed.  40 tablet  0  . FLUoxetine (PROZAC) 40 MG capsule Take 40 mg by mouth daily.        . methocarbamol (ROBAXIN) 750 MG tablet Take 750 mg by mouth 2 (two) times daily as needed.        . metoprolol (LOPRESSOR) 50 MG tablet Take 50 mg by mouth 2 (two) times daily.        . pravastatin (PRAVACHOL) 20 MG tablet Take 20 mg by mouth daily.        . DULoxetine (CYMBALTA) 60  MG capsule Take 1 capsule (60 mg total) by mouth daily.  30 capsule  11   Review of Systems All otherwise neg per pt     Objective:   Physical Exam BP 112/70  Pulse 97  Temp(Src) 99.5 F (37.5 C) (Oral)  Ht 5\' 10"  (1.778 m)  Wt 253 lb 8 oz (114.987 kg)  BMI 36.37 kg/m2  SpO2 96% Physical Exam  VS noted, mild ill Constitutional: Pt appears well-developed and well-nourished.  HENT: Head: Normocephalic.  Right Ear: External ear normal.  Left Ear: External ear normal.  Bilat tm's mild erythema.  Sinus nontender.  Pharynx mild erythema Eyes: Conjunctivae and EOM are normal. Pupils are equal, round, and reactive to light.  Neck: Normal range of motion. Neck supple.  Cardiovascular: Normal rate and regular rhythm.   Pulmonary/Chest: Effort normal and breath sounds mild decreased, mild wheeze.  Skin: Skin is warm. No erythema.  Psychiatric: Pt behavior is normal. Thought content normal.         Assessment & Plan:

## 2011-03-03 NOTE — Assessment & Plan Note (Signed)
Mild to mod, for antibx course,  to f/u any worsening symptoms or concerns 

## 2011-03-03 NOTE — Assessment & Plan Note (Addendum)
Mild to mod, for predpack and depomedrol IM today,  to f/u any worsening symptoms or concerns

## 2011-04-30 ENCOUNTER — Other Ambulatory Visit: Payer: Self-pay

## 2011-04-30 NOTE — Telephone Encounter (Signed)
Needs to be specific about name of med

## 2011-04-30 NOTE — Telephone Encounter (Addendum)
Patient requesting a refill on her pain medication.as she has been on more than one kind in the past

## 2011-05-01 MED ORDER — OXYCODONE HCL 5 MG PO TABS: 5.0000 mg | ORAL_TABLET | Freq: Four times a day (QID) | ORAL | Status: DC | PRN

## 2011-05-01 NOTE — Telephone Encounter (Signed)
Done hardcopy to robin  

## 2011-05-01 NOTE — Telephone Encounter (Signed)
Called the patient back the med. Is Oxycodone 5 mg for back and hip pain.

## 2011-05-01 NOTE — Telephone Encounter (Signed)
Patient informed prescription is ready for pickup at front desk 

## 2011-05-01 NOTE — Telephone Encounter (Signed)
Addended by: Corwin Levins on: 05/01/2011 02:36 PM   Modules accepted: Orders

## 2011-05-14 ENCOUNTER — Ambulatory Visit (INDEPENDENT_AMBULATORY_CARE_PROVIDER_SITE_OTHER): Payer: Self-pay | Admitting: Internal Medicine

## 2011-05-14 ENCOUNTER — Encounter: Payer: Self-pay | Admitting: Internal Medicine

## 2011-05-14 VITALS — BP 122/64 | HR 88 | Temp 98.5°F | Resp 16 | Wt 253.0 lb

## 2011-05-14 DIAGNOSIS — J329 Chronic sinusitis, unspecified: Secondary | ICD-10-CM

## 2011-05-14 DIAGNOSIS — IMO0002 Reserved for concepts with insufficient information to code with codable children: Secondary | ICD-10-CM

## 2011-05-14 MED ORDER — AMOXICILLIN 875 MG PO TABS: 875.0000 mg | ORAL_TABLET | Freq: Two times a day (BID) | ORAL | Status: AC

## 2011-05-14 MED ORDER — PROMETHAZINE-CODEINE 6.25-10 MG/5ML PO SYRP: 5.0000 mL | ORAL_SOLUTION | ORAL | Status: AC | PRN

## 2011-05-14 MED ORDER — DULOXETINE HCL 60 MG PO CPEP: 60.0000 mg | ORAL_CAPSULE | Freq: Every day | ORAL | Status: DC

## 2011-05-14 NOTE — Progress Notes (Signed)
  Subjective:    Patient ID: [name], female    DOB: 1960-04-06, 51 y.o.   MRN: [id number]  HPI Mr.s Vandergrift  Presents with a 4 day h/o sinus pressure, sneezing, coughing, headache, sinus pressure, ears feel very full, swollen lymph nodes submandibular region. Mucus looks productive -greenish, bitter tasting. She feels feverish. Her husband has been sick. She tried codeine cough syrup. She has not been taking any decongestants or antihistamines. She is a smoker.   Past Medical History  Diagnosis Date  . DEPRESSION 10/02/2006  . ANXIETY 01/07/2007  . HYPERLIPIDEMIA 06/08/2008  . ASTHMA 10/02/2006  . SYMPTOM, PALPITATIONS 01/07/2007  . Headache 10/02/2006    Migrane  . PAIN, CHRONIC NEC 10/02/2006  . PNEUMONIA, ORGANISM NOS 01/07/2007  . Hypertension   . BACK PAIN 05/28/2007    Recurrent   Past Surgical History  Procedure Date  . Cesarean section     x2  . Abdominal hysterectomy     1997  . Appendectomy   . Oophorectomy 1996/2000  . Tonsillectomy   . Foot surgery     Left foot  . Knee surgery 2000    Right Knee-Cartilage Problem   Family History  Problem Relation Age of Onset  . Cancer Mother     Ovarian Cancer  . Cancer Other     Colon Cancer   History   Social History  . Marital Status: Married    Spouse Name: N/A    Number of Children: N/A  . Years of Education: N/A   Occupational History  . Not on file.   Social History Main Topics  . Smoking status: Current Everyday Smoker  . Smokeless tobacco: Not on file  . Alcohol Use: No  . Drug Use: No  . Sexually Active:    Other Topics Concern  . Not on file   Social History Narrative  . No narrative on file       Review of Systems System review is negative for any constitutional, cardiac, pulmonary, GI or neuro symptoms or complaints other than as described in the HPI.     Objective:   Physical Exam Filed Vitals:   05/14/11 1117  BP: 122/64  Pulse: 88  Temp: 98.5 F (36.9 C)  Resp: 16    Gen'l - obese white woman in mild discomfort. HEENT- TMs normal, throat with cobblestone appearance, sinus tenderness right frontal and maxillary sinus Nodes- tender submandibular nodes bilaterally Chest - Clear Cor - RRR       Assessment & Plan:  Sinusitis -  Plan - amox 875 bid x 7           Promethazine/cod  1 tsp q 4 prn           Sudafed, APAP, hydrate, etc.

## 2011-05-14 NOTE — Patient Instructions (Addendum)
Sinusitis - right frontal and maxillary sinus involved. Ears and throat and lungs OK. Plan - amoxicillin 875 mg twice a day for 7 days; promethazine-codeine 1 tsp every 4 hours as needed, supportive care see below.  Sinusitis Sinuses are air pockets within the bones of your face. The growth of bacteria within a sinus leads to infection. The infection prevents the sinuses from draining. This infection is called sinusitis. SYMPTOMS   There will be different areas of pain depending on which sinuses have become infected.  The maxillary sinuses often produce pain beneath the eyes.     Frontal sinusitis may cause pain in the middle of the forehead and above the eyes.  Other problems (symptoms) include:  Toothaches.     Colored, pus-like (purulent) drainage from the nose.     Swelling, warmth, and tenderness over the sinus areas may be signs of infection.  TREATMENT   Sinusitis is most often determined by an exam.X-rays may be taken. If x-rays have been taken, make sure you obtain your results or find out how you are to obtain them. Your caregiver may give you medications (antibiotics). These are medications that will help kill the bacteria causing the infection. You may also be given a medication (decongestant) that helps to reduce sinus swelling.   HOME CARE INSTRUCTIONS    Only take over-the-counter or prescription medicines for pain, discomfort, or fever as directed by your caregiver.     Drink extra fluids. Fluids help thin the mucus so your sinuses can drain more easily.     Applying either moist heat or ice packs to the sinus areas may help relieve discomfort.     Use saline nasal sprays to help moisten your sinuses. The sprays can be found at your local drugstore.  SEEK IMMEDIATE MEDICAL CARE IF:  You have a fever.     You have increasing pain, severe headaches, or toothache.     You have nausea, vomiting, or drowsiness.     You develop unusual swelling around the face or trouble  seeing.  MAKE SURE YOU:    Understand these instructions.     Will watch your condition.     Will get help right away if you are not doing well or get worse.  Document Released: 03/11/2005 Document Revised: 11/21/2010 Document Reviewed: 10/08/2006 [facility] Patient Information 2012 [place], [place].   Smoking Cessation This document explains the best ways for you to quit smoking and new treatments to help. It lists new medicines that can double or triple your chances of quitting and quitting for good. It also considers ways to avoid relapses and concerns you may have about quitting, including weight gain. NICOTINE: A POWERFUL ADDICTION If you have tried to quit smoking, you know how hard it can be. It is hard because nicotine is a very addictive drug. For some people, it can be as addictive as heroin or cocaine. Usually, people make 2 or 3 tries, or more, before finally being able to quit. Each time you try to quit, you can learn about what helps and what hurts. Quitting takes hard work and a lot of effort, but you can quit smoking. QUITTING SMOKING IS ONE OF THE MOST IMPORTANT THINGS YOU WILL EVER DO.  You will live longer, feel better, and live better.     The impact on your body of quitting smoking is felt almost immediately:     Within 20 minutes, blood pressure decreases. Pulse returns to its normal level.  After 8 hours, carbon monoxide levels in the blood return to normal. Oxygen level increases.     After 24 hours, chance of heart attack starts to decrease. Breath, hair, and body stop smelling like smoke.     After 48 hours, damaged nerve endings begin to recover. Sense of taste and smell improve.     After 72 hours, the body is virtually free of nicotine. Bronchial tubes relax and breathing becomes easier.     After 2 to 12 weeks, lungs can hold more air. Exercise becomes easier and circulation improves.     Quitting will reduce your risk of having a heart attack, stroke,  cancer, or lung disease:     After 1 year, the risk of coronary heart disease is cut in half.     After 5 years, the risk of stroke falls to the same as a nonsmoker.     After 10 years, the risk of lung cancer is cut in half and the risk of other cancers decreases significantly.     After 15 years, the risk of coronary heart disease drops, usually to the level of a nonsmoker.     If you are pregnant, quitting smoking will improve your chances of having a healthy baby.     The people you live with, especially your children, will be healthier.     You will have extra money to spend on things other than cigarettes.  FIVE KEYS TO QUITTING Studies have shown that these 5 steps will help you quit smoking and quit for good. You have the best chances of quitting if you use them together: 1. Get ready.    2. Get support and encouragement.    3. Learn new skills and behaviors.    4. Get medicine to reduce your nicotine addiction and use it correctly.    5. Be prepared for relapse or difficult situations. Be determined to continue trying to quit, even if you do not succeed at first.  1. GET READY  Set a quit date.     Change your environment.     Get rid of ALL cigarettes, ashtrays, matches, and lighters in your home, car, and place of work.     Do not let people smoke in your home.     Review your past attempts to quit. Think about what worked and what did not.     Once you quit, do not smoke. NOT EVEN A PUFF!  2. GET SUPPORT AND ENCOURAGEMENT Studies have shown that you have a better chance of being successful if you have help. You can get support in many ways.  Tell your family, friends, and coworkers that you are going to quit and need their support. Ask them not to smoke around you.     Talk to your caregivers (doctor, dentist, nurse, pharmacist, psychologist, and/or smoking counselor).     Get individual, group, or telephone counseling and support. The more counseling you have,  the better your chances are of quitting. Programs are available at [employer] and health centers. Call your local health department for information about programs in your area.     Spiritual beliefs and practices may help some smokers quit.     Quit meters are Photographer that keep track of quit statistics, such as amount of "quit-time," cigarettes not smoked, and money saved.     Many smokers find one or more of the many self-help books available useful in helping them  quit and stay off tobacco.  3. LEARN NEW SKILLS AND BEHAVIORS  Try to distract yourself from urges to smoke. Talk to someone, go for a walk, or occupy your time with a task.     When you first try to quit, change your routine. Take a different route to work. Drink tea instead of coffee. Eat breakfast in a different place.     Do something to reduce your stress. Take a hot bath, exercise, or read a book.     Plan something enjoyable to do every day. Reward yourself for not smoking.     Explore interactive web-based programs that specialize in helping you quit.  4. GET MEDICINE AND USE IT CORRECTLY Medicines can help you stop smoking and decrease the urge to smoke. Combining medicine with the above behavioral methods and support can quadruple your chances of successfully quitting smoking. The U[place]. Food and Drug Administration (FDA) has approved 7 medicines to help you quit smoking. These medicines fall into 3 categories.  Nicotine replacement therapy (delivers nicotine to your body without the negative effects and risks of smoking):     Nicotine gum: Available over-the-counter.     Nicotine lozenges: Available over-the-counter.     Nicotine inhaler: Available by prescription.     Nicotine nasal spray: Available by prescription.     Nicotine skin patches (transdermal): Available by prescription and over-the-counter.     Antidepressant medicine (helps people abstain from smoking,  but how this works is unknown):     Bupropion sustained-release (SR) tablets: Available by prescription.     Nicotinic receptor partial agonist (simulates the effect of nicotine in your brain):     Varenicline tartrate tablets: Available by prescription.     Ask your caregiver for advice about which medicines to use and how to use them. Carefully read the information on the package.     Everyone who is trying to quit may benefit from using a medicine. If you are pregnant or trying to become pregnant, nursing an infant, you are under age 31, or you smoke fewer than 10 cigarettes per day, talk to your caregiver before taking any nicotine replacement medicines.     You should stop using a nicotine replacement product and call your caregiver if you experience nausea, dizziness, weakness, vomiting, fast or irregular heartbeat, mouth problems with the lozenge or gum, or redness or swelling of the skin around the patch that does not go away.     Do not use any other product containing nicotine while using a nicotine replacement product.     Talk to your caregiver before using these products if you have diabetes, heart disease, asthma, stomach ulcers, you had a recent heart attack, you have high blood pressure that is not controlled with medicine, a history of irregular heartbeat, or you have been prescribed medicine to help you quit smoking.  5. BE PREPARED FOR RELAPSE OR DIFFICULT SITUATIONS  Most relapses occur within the first 3 months after quitting. Do not be discouraged if you start smoking again. Remember, most people try several times before they finally quit.     You may have symptoms of withdrawal because your body is used to nicotine. You may crave cigarettes, be irritable, feel very hungry, cough often, get headaches, or have difficulty concentrating.     The withdrawal symptoms are only temporary. They are strongest when you first quit, but they will go away within 10 to 14 days.  Here  are some difficult situations to  watch for:  Alcohol. Avoid drinking alcohol. Drinking lowers your chances of successfully quitting.     Caffeine. Try to reduce the amount of caffeine you consume. It also lowers your chances of successfully quitting.     Other smokers. Being around smoking can make you want to smoke. Avoid smokers.     Weight gain. Many smokers will gain weight when they quit, usually less than 10 pounds. Eat a healthy diet and stay active. Do not let weight gain distract you from your main goal, quitting smoking. Some medicines that help you quit smoking may also help delay weight gain. You can always lose the weight gained after you quit.     Bad mood or depression. There are a lot of ways to improve your mood other than smoking.  If you are having problems with any of these situations, talk to your caregiver. SPECIAL SITUATIONS AND CONDITIONS Studies suggest that everyone can quit smoking. Your situation or condition can give you a special reason to quit.  Pregnant women/new mothers: By quitting, you protect your baby's health and your own.     Hospitalized patients: By quitting, you reduce health problems and help healing.     Heart attack patients: By quitting, you reduce your risk of a second heart attack.     Lung, head, and neck cancer patients: By quitting, you reduce your chance of a second cancer.     Parents of children and adolescents: By quitting, you protect your children from illnesses caused by secondhand smoke.  QUESTIONS TO THINK ABOUT Think about the following questions before you try to stop smoking. You may want to talk about your answers with your caregiver.  Why do you want to quit?     If you tried to quit in the past, what helped and what did not?     What will be the most difficult situations for you after you quit? How will you plan to handle them?     Who can help you through the tough times? Your family? Friends? Caregiver?     What  pleasures do you get from smoking? What ways can you still get pleasure if you quit?  Here are some questions to ask your caregiver:  How can you help me to be successful at quitting?     What medicine do you think would be best for me and how should I take it?     What should I do if I need more help?     What is smoking withdrawal like? How can I get information on withdrawal?  Quitting takes hard work and a lot of effort, but you can quit smoking. FOR MORE INFORMATION   Smokefree.gov (http://www.davis-sullivan.com/) provides free, accurate, evidence-based information and professional assistance to help support the immediate and long-term needs of people trying to quit smoking. Document Released: 03/05/2001 Document Revised: 11/21/2010 Document Reviewed: 12/26/2008 [facility] Patient Information 2012 [place], [place].

## 2011-08-22 ENCOUNTER — Emergency Department (HOSPITAL_COMMUNITY): Payer: Self-pay

## 2011-08-22 ENCOUNTER — Encounter (HOSPITAL_COMMUNITY): Payer: Self-pay | Admitting: Emergency Medicine

## 2011-08-22 ENCOUNTER — Emergency Department (HOSPITAL_COMMUNITY)
Admission: EM | Admit: 2011-08-22 | Discharge: 2011-08-22 | Disposition: A | Discharge status: Home / Self Care | Payer: Self-pay | Attending: Emergency Medicine | Admitting: Emergency Medicine

## 2011-08-22 DIAGNOSIS — F17200 Nicotine dependence, unspecified, uncomplicated: Secondary | ICD-10-CM | POA: Insufficient documentation

## 2011-08-22 DIAGNOSIS — E785 Hyperlipidemia, unspecified: Secondary | ICD-10-CM | POA: Insufficient documentation

## 2011-08-22 DIAGNOSIS — M79609 Pain in unspecified limb: Secondary | ICD-10-CM | POA: Insufficient documentation

## 2011-08-22 DIAGNOSIS — W230XXA Caught, crushed, jammed, or pinched between moving objects, initial encounter: Secondary | ICD-10-CM | POA: Insufficient documentation

## 2011-08-22 DIAGNOSIS — J45909 Unspecified asthma, uncomplicated: Secondary | ICD-10-CM | POA: Insufficient documentation

## 2011-08-22 DIAGNOSIS — S6000XA Contusion of unspecified finger without damage to nail, initial encounter: Secondary | ICD-10-CM | POA: Insufficient documentation

## 2011-08-22 MED ORDER — ACETAMINOPHEN 325 MG PO TABS
650.0000 mg | ORAL_TABLET | Freq: Once | ORAL | Status: AC
  Administered 2011-08-22: 650 mg via ORAL
  Filled 2011-08-22: qty 2

## 2011-08-22 NOTE — ED Notes (Signed)
States that she closed her right fifth digit in the dryer about 1 hour ago.

## 2011-08-22 NOTE — Discharge Instructions (Signed)
Bone Bruise  A bone bruise is a small hidden fracture of the bone. It typically occurs with bones located close to the surface of the skin.  SYMPTOMS  The pain lasts longer than a normal bruise.   The bruised area is difficult to use.   There may be discoloration or swelling of the bruised area.   When a bone bruise is found with injury to the anterior cruciate ligament (in the knee) there is often an increased:   Amount of fluid in the knee   Time the fluid in the knee lasts.   Number of days until you are walking normally and regaining the motion you had before the injury.   Number of days with pain from the injury.  DIAGNOSIS  It can only be seen on X-rays known as MRIs. This stands for magnetic resonance imaging. A regular X-ray taken of a bone bruise would appear to be normal. A bone bruise is a common injury in the knee and the heel bone (calcaneus). The problems are similar to those produced by stress fractures, which are bone injuries caused by overuse. A bone bruise may also be a sign of other injuries. For example, bone bruises are commonly found where an anterior cruciate ligament (ACL) in the knee has been pulled away from the bone (ruptured). A ligament is a tough fibrous material that connects bones together to make our joints stable. Bruises of the bone last a lot longer than bruises of the muscle or tissues beneath the skin. Bone bruises can last from days to months and are often more severe and painful than other bruises. TREATMENT Because bone bruises are sudden injuries you cannot often prevent them, other than by being extremely careful. Some things you can do to improve the condition are:  Apply ice to the sore area for 15 to 20 minutes, 3 to 4 times per day while awake for the first 2 days. Put the ice in a plastic bag, and place a towel between the bag of ice and your skin.   Keep your bruised area raised (elevated) when possible to lessen swelling.   For activity:     Use crutches when necessary; do not put weight on the injured leg until you are no longer tender.   You may walk on your affected part as the pain allows, or as instructed.   Start weight bearing gradually on the bruised part.   Continue to use crutches or a cane until you can stand without causing pain, or as instructed.   If a plaster splint was applied, wear the splint until you are seen for a follow-up examination. Rest it on nothing harder than a pillow the first 24 hours. Do not put weight on it. Do not get it wet. You may take it off to take a shower or bath.   If an air splint was applied, more air may be blown into or out of the splint as needed for comfort. You may take it off at night and to take a shower or bath.   Wiggle your toes in the splint several times per day if you are able.   You may have been given an elastic bandage to use with the plaster splint or alone. The splint is too tight if you have numbness, tingling or if your foot becomes cold and blue. Adjust the bandage to make it comfortable.   Only take over-the-counter or prescription medicines for pain, discomfort, or fever as directed by   your caregiver.   Follow all instructions for follow up with your caregiver. This includes any orthopedic referrals, physical therapy, and rehabilitation. Any delay in obtaining necessary care could result in a delay or failure of the bones to heal.  SEEK MEDICAL CARE IF:   You have an increase in bruising, swelling, or pain.   You notice coldness of your toes.   You do not get pain relief with medications.  SEEK IMMEDIATE MEDICAL CARE IF:   Your toes are numb or blue.   You have severe pain not controlled with medications.   If any of the problems that caused you to seek care are becoming worse.  Document Released: 06/01/2003 Document Revised: 02/28/2011 Document Reviewed: 10/14/2007 [employer] Patient Information 2012 [employer].  Contusion A contusion is a deep  bruise. Contusions are the result of an injury that caused bleeding under the skin. The contusion may turn blue, purple, or yellow. Minor injuries will give you a painless contusion, but more severe contusions may stay painful and swollen for a few weeks.  CAUSES  A contusion is usually caused by a blow, trauma, or direct force to an area of the body. SYMPTOMS   Swelling and redness of the injured area.   Bruising of the injured area.   Tenderness and soreness of the injured area.   Pain.  DIAGNOSIS  The diagnosis can be made by taking a history and physical exam. An X-ray, CT scan, or MRI may be needed to determine if there were any associated injuries, such as fractures. TREATMENT  Specific treatment will depend on what area of the body was injured. In general, the best treatment for a contusion is resting, icing, elevating, and applying cold compresses to the injured area. Over-the-counter medicines may also be recommended for pain control. Ask your caregiver what the best treatment is for your contusion. HOME CARE INSTRUCTIONS   Put ice on the injured area.   Put ice in a plastic bag.   Place a towel between your skin and the bag.   Leave the ice on for 15 to 20 minutes, 3 to 4 times a day.   Only take over-the-counter or prescription medicines for pain, discomfort, or fever as directed by your caregiver. Your caregiver may recommend avoiding anti-inflammatory medicines (aspirin, ibuprofen, and naproxen) for 48 hours because these medicines may increase bruising.   Rest the injured area.   If possible, elevate the injured area to reduce swelling.  SEEK IMMEDIATE MEDICAL CARE IF:   You have increased bruising or swelling.   You have pain that is getting worse.   Your swelling or pain is not relieved with medicines.  MAKE SURE YOU:   Understand these instructions.   Will watch your condition.   Will get help right away if you are not doing well or get worse.  Document  Released: 12/19/2004 Document Revised: 02/28/2011 Document Reviewed: 01/14/2011 [employer] Patient Information 2012 [employer]. 

## 2011-08-22 NOTE — ED Provider Notes (Signed)
Medical screening examination/treatment/procedure(s) were performed by non-physician practitioner and as supervising physician I was immediately available for consultation/collaboration.   Rolan Bucco, MD 08/22/11 [phone]

## 2011-08-22 NOTE — ED Notes (Signed)
Voiced understanding of instructions given 

## 2011-08-22 NOTE — ED Provider Notes (Signed)
History     CSN: [id number]  Arrival date & time 08/22/11  1144   First MD Initiated Contact with Patient 08/22/11 1154      Chief Complaint  Patient presents with  . Finger Injury    (Consider location/radiation/quality/duration/timing/severity/associated sxs/prior treatment) HPI  She presents to the emergency department with complaints of finger injury prior to arrival. She states that she accidentally closed her pinky finger in the dryer door just right before her arrival to the emergency department. She denies her finger bleeding, denies deformity. He denies injuring any other part of her body. She states that her finger does hurt very bad that she can feel it and she can move it. The finger is still warm she states. VSS NAD  Past Medical History  Diagnosis Date  . DEPRESSION 10/02/2006  . ANXIETY 01/07/2007  . HYPERLIPIDEMIA 06/08/2008  . ASTHMA 10/02/2006  . SYMPTOM, PALPITATIONS 01/07/2007  . Headache 10/02/2006    Migrane  . PAIN, CHRONIC NEC 10/02/2006  . PNEUMONIA, ORGANISM NOS 01/07/2007  . Hypertension   . BACK PAIN 05/28/2007    Recurrent    Past Surgical History  Procedure Date  . Cesarean section     x2  . Abdominal hysterectomy     1997  . Appendectomy   . Oophorectomy 1996/2000  . Tonsillectomy   . Foot surgery     Left foot  . Knee surgery 2000    Right Knee-Cartilage Problem    Family History  Problem Relation Age of Onset  . Heart disease Mother     CAD/CABG, died of heart attack  . Cancer Other     Colon Cancer  . Arthritis Father   . Diabetes Father   . Diabetes Sister   . COPD Sister   . Cancer Brother     thyroid cancer  . Cancer Maternal Grandmother     ovarian cancer  . Diabetes Sister   . Heart disease Sister     History  Substance Use Topics  . Smoking status: Current Everyday Smoker  . Smokeless tobacco: Never Used  . Alcohol Use: No    OB History    Grav Para Term Preterm Abortions TAB SAB Ect Mult Living        Review of Systems   HEENT: denies blurry vision or change in hearing PULMONARY: Denies difficulty breathing and SOB CARDIAC: denies chest pain or heart palpitations MUSCULOSKELETAL:  denies being unable to ambulate ABDOMEN AL: denies abdominal pain GU: denies loss of bowel or urinary control NEURO: denies numbness and tingling in extremities   Allergies  Aspirin and Sulfonamide derivatives  Home Medications   Current Outpatient Rx  Name Route Sig Dispense Refill  . ALPRAZOLAM 0.5 MG PO TABS Oral Take 0.5 mg by mouth 3 (three) times daily as needed. For anxiety    . BUTALBITAL-APAP-CAFFEINE 50-325-40 MG PO TABS Oral Take 1 tablet by mouth every 6 (six) hours as needed. For migraines    . METHOCARBAMOL 750 MG PO TABS Oral Take 750 mg by mouth 2 (two) times daily as needed. For muscle spasm    . METOPROLOL TARTRATE 50 MG PO TABS Oral Take 50 mg by mouth 2 (two) times daily.      . DULOXETINE HCL 60 MG PO CPEP Oral Take 1 capsule (60 mg total) by mouth daily. 28 capsule 0    BP 151/75  Pulse 91  Temp(Src) 97.6 F (36.4 C) (Oral)  Resp 18  SpO2 99%  Physical Exam  Nursing note and vitals reviewed. Constitutional: She appears well-developed and well-nourished. No distress.  HENT:  Head: Normocephalic and atraumatic.  Eyes: Pupils are equal, round, and reactive to light.  Neck: Normal range of motion. Neck supple.  Cardiovascular: Normal rate and regular rhythm.   Pulmonary/Chest: Effort normal.  Abdominal: Soft.  Musculoskeletal:       Right hand: She exhibits decreased range of motion (due to pain), tenderness and swelling. She exhibits no bony tenderness, normal two-point discrimination, normal capillary refill, no deformity and no laceration.       Hands: Neurological: She is alert.  Skin: Skin is warm and dry.    ED Course  Procedures (including critical care time)  Labs Reviewed - No data to display Dg Hand Complete Right  08/22/2011  *RADIOLOGY REPORT*   Clinical Data: Closed fifth digit in the door.  RIGHT HAND - COMPLETE 3+ VIEW  Comparison: None.  Findings: Well circumscribed lucent lesion is noted within the right fifth distal phalanx.  Given the appearance and location, this most likely represents an enchondroma.  No acute bony abnormality.  No fracture, subluxation or dislocation.  Soft tissues are intact.  IMPRESSION: No acute bony abnormality.  Incidental cystic lesion in the distal fifth phalanx, likely enchondroma.  Original Report Authenticated By: Cyndie Chime, M.D.     1. Finger contusion       MDM  Contusion to finger, no laceration, deformity or fracture. Pt placed in figner splint for comfort. Given Hand referral.  Pt has been advised of the symptoms that warrant their return to the ED. Patient has voiced understanding and has agreed to follow-up with the PCP or specialist.         Dorthula Matas, PA 08/22/11 1332

## 2011-10-28 ENCOUNTER — Encounter: Payer: Self-pay | Admitting: Internal Medicine

## 2011-10-28 ENCOUNTER — Ambulatory Visit (INDEPENDENT_AMBULATORY_CARE_PROVIDER_SITE_OTHER): Payer: Self-pay | Admitting: Internal Medicine

## 2011-10-28 ENCOUNTER — Ambulatory Visit (INDEPENDENT_AMBULATORY_CARE_PROVIDER_SITE_OTHER)
Admission: RE | Admit: 2011-10-28 | Discharge: 2011-10-28 | Disposition: A | Discharge status: Home / Self Care | Payer: Self-pay | Source: Ambulatory Visit | Attending: Internal Medicine | Admitting: Internal Medicine

## 2011-10-28 VITALS — BP 143/86 | HR 90 | Temp 97.6°F | Ht 71.0 in | Wt 255.5 lb

## 2011-10-28 DIAGNOSIS — R059 Cough, unspecified: Secondary | ICD-10-CM

## 2011-10-28 DIAGNOSIS — J309 Allergic rhinitis, unspecified: Secondary | ICD-10-CM

## 2011-10-28 DIAGNOSIS — R05 Cough: Secondary | ICD-10-CM

## 2011-10-28 DIAGNOSIS — IMO0002 Reserved for concepts with insufficient information to code with codable children: Secondary | ICD-10-CM

## 2011-10-28 DIAGNOSIS — G8929 Other chronic pain: Secondary | ICD-10-CM

## 2011-10-28 MED ORDER — BUTALBITAL-APAP-CAFFEINE 50-325-40 MG PO TABS: 1.0000 | ORAL_TABLET | Freq: Four times a day (QID) | ORAL | Status: DC | PRN

## 2011-10-28 MED ORDER — DULOXETINE HCL 60 MG PO CPEP: 60.0000 mg | ORAL_CAPSULE | Freq: Every day | ORAL | Status: DC

## 2011-10-28 MED ORDER — OXYCODONE HCL 5 MG PO TABS: 5.0000 mg | ORAL_TABLET | Freq: Four times a day (QID) | ORAL | Status: AC | PRN

## 2011-10-28 MED ORDER — FLUTICASONE PROPIONATE 50 MCG/ACT NA SUSP: 2.0000 | Freq: Every day | NASAL | Status: DC

## 2011-10-28 MED ORDER — CEPHALEXIN 500 MG PO CAPS: 500.0000 mg | ORAL_CAPSULE | Freq: Four times a day (QID) | ORAL | Status: AC

## 2011-10-28 NOTE — Assessment & Plan Note (Signed)
stable overall by hx and exam, for pain med refill, to f/u with surgury after sept - pt to call she states

## 2011-10-28 NOTE — Progress Notes (Signed)
Subjective:    Patient ID: [name], female    DOB: 15-Jan-1961, 51 y.o.   MRN: [id number]  HPI  Here with acute onset mild to mod 1 wk feverish, ST, HA, general weakness and malaise, with prod cough greenish sputum, but Pt denies chest pain, orthopnea, PND, increased LE swelling, palpitations, dizziness or syncope, though has had some sense of bronchial congestion and intermittent cough for 2 yrs.  No wheezing but some sob, not doe. Has right hear pressure and popping/crackling, with several wks ongoing nasal allergy symptoms with clear congestion, itch and sneeze, without fever, pain, ST, cough or wheezing.  Also asks for cymbalta samples today, doesn't have insurance at this time, but thinks may have coverage starting September, asks for rx also to keep on hand at pharmacy, has rather dramatic initial response previously within 1 wk with improved mood, sleep and pain.  Pt continues to have recurring LBP without change in severity, bowel or bladder change, fever, wt loss,  worsening LE pain/numbness/weakness, gait change or falls; has not been able to f/u with surgury due to lack of insuarancel, pain now mod to severe in the past wk.  Asks for CXR today due to cough, but also with hx of pulm nodule Past Medical History  Diagnosis Date  . DEPRESSION 10/02/2006  . ANXIETY 01/07/2007  . HYPERLIPIDEMIA 06/08/2008  . ASTHMA 10/02/2006  . SYMPTOM, PALPITATIONS 01/07/2007  . Headache 10/02/2006    Migrane  . PAIN, CHRONIC NEC 10/02/2006  . PNEUMONIA, ORGANISM NOS 01/07/2007  . Hypertension   . BACK PAIN 05/28/2007    Recurrent   Past Surgical History  Procedure Date  . Cesarean section     x2  . Abdominal hysterectomy     1997  . Appendectomy   . Oophorectomy 1996/2000  . Tonsillectomy   . Foot surgery     Left foot  . Knee surgery 2000    Right Knee-Cartilage Problem    reports that she has been smoking.  She has never used smokeless tobacco. She reports that she does not drink  alcohol or use illicit drugs. family history includes Arthritis in her father; COPD in her sister; Cancer in her brother, maternal grandmother, and other; Diabetes in her father and sisters; and Heart disease in her mother and sister. Allergies  Allergen Reactions  . Aspirin   . Sulfonamide Derivatives    Current Outpatient Prescriptions on File Prior to Visit  Medication Sig Dispense Refill  . ALPRAZolam (XANAX) 0.5 MG tablet Take 0.5 mg by mouth 3 (three) times daily as needed. For anxiety      . methocarbamol (ROBAXIN) 750 MG tablet Take 750 mg by mouth 2 (two) times daily as needed. For muscle spasm      . metoprolol (LOPRESSOR) 50 MG tablet Take 50 mg by mouth 2 (two) times daily.        . fluticasone (FLONASE) 50 MCG/ACT nasal spray Place 2 sprays into the nose daily.  16 g  2  . DISCONTD: DULoxetine (CYMBALTA) 60 MG capsule Take 1 capsule (60 mg total) by mouth daily.  28 capsule  0   Review of Systems Constitutional: Negative for diaphoresis and unexpected weight change.  HENT: Negative for drooling and tinnitus.   Eyes: Negative for photophobia and visual disturbance.  Respiratory: Negative for choking and stridor.   Gastrointestinal: Negative for vomiting and blood in stool.  Genitourinary: Negative for hematuria and decreased urine volume.  Musculoskeletal: Negative for gait problem.  Skin:  Negative for color change and wound.  Objective:   Physical Exam BP 143/86  Pulse 90  Temp 97.6 F (36.4 C) (Oral)  Ht 5\' 11"  (1.803 m)  Wt 255 lb 8 oz (115.894 kg)  BMI 35.64 kg/m2  SpO2 98% Physical Exam  VS noted, mild ill appearing Constitutional: Pt appears well-developed and well-nourished.  HENT: Head: Normocephalic.  Right Ear: External ear normal.  Left Ear: External ear normal.  Right tm's mild erythema with light reflex, nonbulging,  Sinus nontender.  Pharynx mild erythema Eyes: Conjunctivae and EOM are normal. Pupils are equal, round, and reactive to light.  Neck:  Normal range of motion. Neck supple.  Cardiovascular: Normal rate and regular rhythm.   Pulmonary/Chest: Effort normal and breath sounds mild decreased with ? Of RUL few crackles Abd:  Soft, NT, non-distended, + BS Neurological: Pt is alert. Spine with diffuse lumbar midline tender, stiff gait.  Skin: Skin is warm. No erythema. No rash Psychiatric: Pt behavior is normal. Thought content normal. 1-2+ nervous, depressed mood    Assessment & Plan:

## 2011-10-28 NOTE — Assessment & Plan Note (Signed)
?   Acute bronchitis vs pna vs other - for cxr per pt ruquest, Mild to mod, for antibx course,  to f/u any worsening symptoms or concerns

## 2011-10-28 NOTE — Patient Instructions (Addendum)
Take all new medications as prescribed - the antibiotic, and the flonase (sent to pharmacy) You can also take Mucinex (or it's generic off brand) for congestion, and ear symptoms Please also consider taking Allegra OTC for the ear and allergy symptoms You are given the one month of cymbalta, and a prescription is sent to the pharmacy Please go to XRAY in the Basement for the x-ray test You will be contacted by phone if any changes need to be made immediately.  Otherwise, you will receive a letter about your results with an explanation. Continue all other medications as before, including the pain medication refill given today

## 2011-10-28 NOTE — Assessment & Plan Note (Signed)
Gave 1 mo sample cymbalta 60 mg, as well as rx as this did do well for her mood, sleep, and pain previously

## 2011-10-28 NOTE — Assessment & Plan Note (Signed)
With right ear pressure related most likely, for allegra otc, as well rx for flonase if able to afford, also for mucinex otc prn

## 2012-02-14 ENCOUNTER — Ambulatory Visit (INDEPENDENT_AMBULATORY_CARE_PROVIDER_SITE_OTHER): Payer: Self-pay | Admitting: Internal Medicine

## 2012-02-14 ENCOUNTER — Encounter: Payer: Self-pay | Admitting: Internal Medicine

## 2012-02-14 VITALS — BP 134/80 | HR 94 | Temp 99.3°F | Ht 70.5 in | Wt 258.5 lb

## 2012-02-14 DIAGNOSIS — IMO0002 Reserved for concepts with insufficient information to code with codable children: Secondary | ICD-10-CM

## 2012-02-14 DIAGNOSIS — G8929 Other chronic pain: Secondary | ICD-10-CM

## 2012-02-14 DIAGNOSIS — J019 Acute sinusitis, unspecified: Secondary | ICD-10-CM | POA: Insufficient documentation

## 2012-02-14 DIAGNOSIS — Z0000 Encounter for general adult medical examination without abnormal findings: Secondary | ICD-10-CM

## 2012-02-14 MED ORDER — DOXYCYCLINE HYCLATE 100 MG PO TABS: 100.0000 mg | ORAL_TABLET | Freq: Two times a day (BID) | ORAL | Status: DC

## 2012-02-14 MED ORDER — OXYCODONE HCL 5 MG PO TABS: 5.0000 mg | ORAL_TABLET | Freq: Four times a day (QID) | ORAL | Status: DC | PRN

## 2012-02-14 MED ORDER — BUTALBITAL-APAP-CAFFEINE 50-325-40 MG PO TABS: 1.0000 | ORAL_TABLET | Freq: Four times a day (QID) | ORAL | Status: DC | PRN

## 2012-02-14 MED ORDER — MUPIROCIN CALCIUM 2 % EX CREA: TOPICAL_CREAM | Freq: Three times a day (TID) | CUTANEOUS | Status: DC

## 2012-02-14 NOTE — Progress Notes (Signed)
Subjective:    Patient ID: [name], female    DOB: 05-26-1960, 51 y.o.   MRN: [id number]  HPI   Here with 3 days acute onset fever, facial pain, pressure, general weakness and malaise, and greenish d/c, with slight ST, but little to no cough and Pt denies chest pain, increased sob or doe, wheezing, orthopnea, PND, increased LE swelling, palpitations, dizziness or syncope.  Pt denies new neurological symptoms such as new headache, or facial or extremity weakness or numbness   Pt denies polydipsia, polyuria. Chronic LBP overall stable but still persistnet mod to severe, will have insurance starting Mar 25, 2012 and is eager to see ortho after that.  Migraine pattern stable in freq and severity, fioricet still works and needs refill, only uses 1-2 per wk Past Medical History  Diagnosis Date  . DEPRESSION 10/02/2006  . ANXIETY 01/07/2007  . HYPERLIPIDEMIA 06/08/2008  . ASTHMA 10/02/2006  . SYMPTOM, PALPITATIONS 01/07/2007  . Headache 10/02/2006    Migrane  . PAIN, CHRONIC NEC 10/02/2006  . PNEUMONIA, ORGANISM NOS 01/07/2007  . Hypertension   . BACK PAIN 05/28/2007    Recurrent   Past Surgical History  Procedure Date  . Cesarean section     x2  . Abdominal hysterectomy     1997  . Appendectomy   . Oophorectomy 1996/2000  . Tonsillectomy   . Foot surgery     Left foot  . Knee surgery 2000    Right Knee-Cartilage Problem    reports that she has been smoking.  She has never used smokeless tobacco. She reports that she does not drink alcohol or use illicit drugs. family history includes Arthritis in her father; COPD in her sister; Cancer in her brother, maternal grandmother, and other; Diabetes in her father and sisters; and Heart disease in her mother and sister. Allergies  Allergen Reactions  . Aspirin   . Sulfonamide Derivatives    . Current Outpatient Prescriptions on File Prior to Visit  Medication Sig Dispense Refill  . ALPRAZolam (XANAX) 0.5 MG tablet Take 0.5 mg by mouth  3 (three) times daily as needed. For anxiety      . DULoxetine (CYMBALTA) 60 MG capsule Take 1 capsule (60 mg total) by mouth daily.  30 capsule  11  . fluticasone (FLONASE) 50 MCG/ACT nasal spray Place 2 sprays into the nose daily.  16 g  2  . methocarbamol (ROBAXIN) 750 MG tablet Take 750 mg by mouth 2 (two) times daily as needed. For muscle spasm      . metoprolol (LOPRESSOR) 50 MG tablet Take 50 mg by mouth 2 (two) times daily.         Review of Systems All otherwise neg per pt     Objective:   Physical Exam BP 134/80  Pulse 94  Temp 99.3 F (37.4 C) (Oral)  Ht 5' 10.5" (1.791 m)  Wt 258 lb 8 oz (117.255 kg)  BMI 36.57 kg/m2  SpO2 98% Physical Exam  VS noted., mild ill Constitutional: Pt appears well-developed and well-nourished.  HENT: Head: Normocephalic.  Right Ear: External ear normal.  Left Ear: External ear normal.  Bilat tm's mild erythema.  Sinus nontender.  Pharynx mild erythema Eyes: Conjunctivae and EOM are normal. Pupils are equal, round, and reactive to light.  Neck: Normal range of motion. Neck supple.  Cardiovascular: Normal rate and regular rhythm.   Pulmonary/Chest: Effort normal and breath sounds normal.  Neurological: Pt is alert. Not confused  Skin: Skin is  warm. No erythema.  Psychiatric: Pt behavior is normal. Thought content normal. 1+ nervous    Assessment & Plan:

## 2012-02-14 NOTE — Assessment & Plan Note (Signed)
For cont'd pain control, refer to ortho after jan 1

## 2012-02-14 NOTE — Assessment & Plan Note (Signed)
Mild to mod, for antibx course,  to f/u any worsening symptoms or concerns 

## 2012-02-14 NOTE — Assessment & Plan Note (Signed)
meds refilled, including fioricet for migraine

## 2012-02-14 NOTE — Patient Instructions (Addendum)
Take all new medications as prescribed Continue all other medications as before Your medications were refilled as requested You will be contacted regarding the referral for: orthopedic for jan 2014 Please return in 3 mo with Lab testing done 3-5 days before

## 2012-03-27 ENCOUNTER — Telehealth: Payer: Self-pay | Admitting: Internal Medicine

## 2012-03-27 MED ORDER — CYCLOBENZAPRINE HCL 5 MG PO TABS: 5.0000 mg | ORAL_TABLET | Freq: Three times a day (TID) | ORAL | Status: DC | PRN

## 2012-03-27 NOTE — Telephone Encounter (Signed)
Done erx 

## 2012-03-27 NOTE — Telephone Encounter (Signed)
Pt req refill for CYCLOBENZAPR 5MG  TAB. Pt stated that this med was given to here in 2012 and Dr. Jonny Ruiz discontinued this med in 10/2010. Pt really need this med and if this is possible can pt get this med today? Please advise and call pt ASAP. Last ov was 02/14/12.

## 2012-03-27 NOTE — Telephone Encounter (Signed)
Patient informed. 

## 2012-04-06 ENCOUNTER — Other Ambulatory Visit (INDEPENDENT_AMBULATORY_CARE_PROVIDER_SITE_OTHER): Payer: BC Managed Care – PPO

## 2012-04-06 DIAGNOSIS — Z0000 Encounter for general adult medical examination without abnormal findings: Secondary | ICD-10-CM

## 2012-04-06 LAB — BASIC METABOLIC PANEL
BUN: 14 mg/dL (ref 6–23)
Chloride: 106 mEq/L (ref 96–112)
Creatinine, Ser: 1 mg/dL (ref 0.4–1.2)
Glucose, Bld: 104 mg/dL — ABNORMAL HIGH (ref 70–99)
Potassium: 4.1 mEq/L (ref 3.5–5.1)

## 2012-04-06 LAB — URINALYSIS, ROUTINE W REFLEX MICROSCOPIC
Bilirubin Urine: NEGATIVE
Nitrite: NEGATIVE
Total Protein, Urine: NEGATIVE
Urine Glucose: NEGATIVE
pH: 5.5 (ref 5.0–8.0)

## 2012-04-06 LAB — CBC WITH DIFFERENTIAL/PLATELET
Eosinophils Absolute: 0.1 10*3/uL (ref 0.0–0.7)
Eosinophils Relative: 1.5 % (ref 0.0–5.0)
Lymphocytes Relative: 35 % (ref 12.0–46.0)
MCHC: 34.2 g/dL (ref 30.0–36.0)
MCV: 93.2 fl (ref 78.0–100.0)
Monocytes Absolute: 0.6 10*3/uL (ref 0.1–1.0)
Neutrophils Relative %: 55.5 % (ref 43.0–77.0)
Platelets: 206 10*3/uL (ref 150.0–400.0)
RBC: 4.59 Mil/uL (ref 3.87–5.11)
WBC: 7.9 10*3/uL (ref 4.5–10.5)

## 2012-04-06 LAB — HEPATIC FUNCTION PANEL
Alkaline Phosphatase: 64 U/L (ref 39–117)
Bilirubin, Direct: 0.1 mg/dL (ref 0.0–0.3)
Total Bilirubin: 0.6 mg/dL (ref 0.3–1.2)

## 2012-04-06 LAB — LDL CHOLESTEROL, DIRECT: Direct LDL: 127.3 mg/dL

## 2012-04-06 LAB — LIPID PANEL
Total CHOL/HDL Ratio: 6
VLDL: 41.4 mg/dL — ABNORMAL HIGH (ref 0.0–40.0)

## 2012-05-14 ENCOUNTER — Encounter: Payer: Self-pay | Admitting: Internal Medicine

## 2012-05-14 ENCOUNTER — Ambulatory Visit (INDEPENDENT_AMBULATORY_CARE_PROVIDER_SITE_OTHER): Payer: BC Managed Care – PPO | Admitting: Internal Medicine

## 2012-05-14 VITALS — BP 140/80 | HR 92 | Temp 98.2°F | Ht 70.5 in | Wt 263.1 lb

## 2012-05-14 DIAGNOSIS — I10 Essential (primary) hypertension: Secondary | ICD-10-CM

## 2012-05-14 DIAGNOSIS — R220 Localized swelling, mass and lump, head: Secondary | ICD-10-CM

## 2012-05-14 DIAGNOSIS — Z23 Encounter for immunization: Secondary | ICD-10-CM

## 2012-05-14 DIAGNOSIS — Z0000 Encounter for general adult medical examination without abnormal findings: Secondary | ICD-10-CM

## 2012-05-14 MED ORDER — METOPROLOL TARTRATE 50 MG PO TABS: 50.0000 mg | ORAL_TABLET | Freq: Two times a day (BID) | ORAL | Status: DC

## 2012-05-14 MED ORDER — CYCLOBENZAPRINE HCL 5 MG PO TABS: 5.0000 mg | ORAL_TABLET | Freq: Three times a day (TID) | ORAL | Status: DC | PRN

## 2012-05-14 MED ORDER — FLUTICASONE PROPIONATE 50 MCG/ACT NA SUSP: 2.0000 | Freq: Every day | NASAL | Status: DC

## 2012-05-14 MED ORDER — ATORVASTATIN CALCIUM 10 MG PO TABS: 10.0000 mg | ORAL_TABLET | Freq: Every day | ORAL | Status: DC

## 2012-05-14 MED ORDER — OXYCODONE HCL 5 MG PO TABS: 5.0000 mg | ORAL_TABLET | Freq: Four times a day (QID) | ORAL | Status: DC | PRN

## 2012-05-14 NOTE — Progress Notes (Signed)
Subjective:    Patient ID: [name], female    DOB: [birth year], 52 y.o.   MRN: [id number]  HPI  Here for wellness and f/u;  Overall doing ok;  Pt denies CP, worsening SOB, DOE, wheezing, orthopnea, PND, worsening LE edema, palpitations, dizziness or syncope.  Pt denies neurological change such as new headache, facial or extremity weakness.  Pt denies polydipsia, polyuria, or low sugar symptoms. Pt states overall good compliance with treatment and medications, good tolerability, and has been trying to follow lower cholesterol diet.  Pt denies worsening depressive symptoms, suicidal ideation or panic. No fever, night sweats, wt loss, loss of appetite, or other constitutional symptoms.  Pt states good ability with ADL's, has low fall risk, home safety reviewed and adequate, no other significant changes in hearing or vision, and only occasionally active with exercise.  Pt continues to have recurring LBP without change in severity, bowel or bladder change, fever, wt loss,  worsening LE pain/numbness/weakness, gait change or falls.  Does also have a persistent swelling/lump like sensation for several months to left post throat.left neck area. Past Medical History  Diagnosis Date  . DEPRESSION 10/02/2006  . ANXIETY 01/07/2007  . HYPERLIPIDEMIA 06/08/2008  . ASTHMA 10/02/2006  . SYMPTOM, PALPITATIONS 01/07/2007  . Headache 10/02/2006    Migrane  . PAIN, CHRONIC NEC 10/02/2006  . PNEUMONIA, ORGANISM NOS 01/07/2007  . Hypertension   . BACK PAIN 05/28/2007    Recurrent   Past Surgical History  Procedure Laterality Date  . Cesarean section      x2  . Abdominal hysterectomy      1997  . Appendectomy    . Oophorectomy  1996/2000  . Tonsillectomy    . Foot surgery      Left foot  . Knee surgery  2000    Right Knee-Cartilage Problem    reports that she has been smoking.  She has never used smokeless tobacco. She reports that she does not drink alcohol or use illicit drugs. family history  includes Arthritis in her father; COPD in her sister; Cancer in her brother, maternal grandmother, and other; Diabetes in her father and sisters; and Heart disease in her mother and sister. Allergies  Allergen Reactions  . Aspirin   . Sulfonamide Derivatives    Current Outpatient Prescriptions on File Prior to Visit  Medication Sig Dispense Refill  . ALPRAZolam (XANAX) 0.5 MG tablet Take 0.5 mg by mouth 3 (three) times daily as needed. For anxiety      . butalbital-acetaminophen-caffeine (FIORICET, ESGIC) 50-325-40 MG per tablet Take 1 tablet by mouth every 6 (six) hours as needed. For migraines  20 tablet  1   No current facility-administered medications on file prior to visit.   Review of Systems Constitutional: Negative for diaphoresis, activity change, appetite change or unexpected weight change.  HENT: Negative for hearing loss, ear pain, facial swelling, mouth sores and neck stiffness.   Eyes: Negative for pain, redness and visual disturbance.  Respiratory: Negative for shortness of breath and wheezing.   Cardiovascular: Negative for chest pain and palpitations.  Gastrointestinal: Negative for diarrhea, blood in stool, abdominal distention or other pain Genitourinary: Negative for hematuria, flank pain or change in urine volume.  Musculoskeletal: Negative for myalgias and joint swelling.  Skin: Negative for color change and wound.  Neurological: Negative for syncope and numbness. other than noted Hematological: Negative for adenopathy.  Psychiatric/Behavioral: Negative for hallucinations, self-injury, decreased concentration and agitation.      Objective:  Physical Exam BP 140/80  Pulse 92  Temp(Src) 98.2 F (36.8 C) (Oral)  Ht 5' 10.5" (1.791 m)  Wt 263 lb 2 oz (119.353 kg)  BMI 37.21 kg/m2  SpO2 98% VS noted,  Constitutional: Pt is oriented to person, place, and time. Appears well-developed and well-nourished.  Head: Normocephalic and atraumatic.  Right Ear: External  ear normal.  Left Ear: External ear normal.  Nose: Nose normal.  Mouth/Throat: Oropharynx is clear and moist. No mass seen Eyes: Conjunctivae and EOM are normal. Pupils are equal, round, and reactive to light.  Neck: Normal range of motion. Neck supple. No JVD present. No tracheal deviation present. No neck mass palpated Cardiovascular: Normal rate, regular rhythm, normal heart sounds and intact distal pulses.   Pulmonary/Chest: Effort normal and breath sounds normal.  Abdominal: Soft. Bowel sounds are normal. There is no tenderness. No HSM  Musculoskeletal: Normal range of motion. Exhibits no edema.  Lymphadenopathy:  Has no cervical adenopathy.  Neurological: Pt is alert and oriented to person, place, and time. Pt has normal reflexes. No cranial nerve deficit.  Skin: Skin is warm and dry. No rash noted.  Psychiatric:  Has  normal mood and affect. Behavior is normal.     Assessment & Plan:

## 2012-05-14 NOTE — Assessment & Plan Note (Signed)
stable overall by history and exam, recent data reviewed with pt, and pt to continue medical treatment as before,  to f/u any worsening symptoms or concerns BP Readings from Last 3 Encounters:  05/14/12 140/80  02/14/12 134/80  10/28/11 143/86   ECG reviewed as per emr

## 2012-05-14 NOTE — Patient Instructions (Addendum)
Your EKG was OK today Please remember to followup with your GYN for the yearly pap smear and/or mammogram (consider calling [facility] on [address]) You will be contacted regarding the referral for: ENT  Please take all new medication as prescribed  - the lipitor Please continue all other medications as before, including the flonase You had the flu shot today You will be contacted regarding the referral for: colonoscopy You are otherwise up to date with prevention measures today. Please continue your efforts at being more active, low cholesterol diet, and weight control. Thank you for enrolling in MyChart. Please follow the instructions below to securely access your online medical record. MyChart allows you to send messages to your doctor, view your test results, renew your prescriptions, schedule appointments, and more. To Log into My Chart online, please go by [employer] or [employer] to [email or web address], or download the MyChart App from the [employer] of [employer] .  Your Username is: [email or web address] (pass [phone]) Please send a practice Message on Mychart later today. Please let us know if you need a new orthopedic referral for your back Please return in 1 year for your yearly visit, or sooner if needed, with Lab testing done 3-5 days before

## 2012-05-14 NOTE — Assessment & Plan Note (Signed)

## 2012-05-21 ENCOUNTER — Other Ambulatory Visit: Payer: Self-pay | Admitting: Otolaryngology

## 2012-05-21 DIAGNOSIS — R220 Localized swelling, mass and lump, head: Secondary | ICD-10-CM

## 2012-05-29 ENCOUNTER — Other Ambulatory Visit: Payer: BC Managed Care – PPO

## 2012-06-01 ENCOUNTER — Ambulatory Visit
Admission: RE | Admit: 2012-06-01 | Discharge: 2012-06-01 | Disposition: A | Discharge status: Home / Self Care | Payer: BC Managed Care – PPO | Source: Ambulatory Visit | Attending: Otolaryngology | Admitting: Otolaryngology

## 2012-06-01 DIAGNOSIS — R220 Localized swelling, mass and lump, head: Secondary | ICD-10-CM

## 2012-06-26 ENCOUNTER — Telehealth: Payer: Self-pay | Admitting: Internal Medicine

## 2012-06-26 MED ORDER — ALBUTEROL SULFATE HFA 108 (90 BASE) MCG/ACT IN AERS: 2.0000 | INHALATION_SPRAY | Freq: Four times a day (QID) | RESPIRATORY_TRACT | Status: DC | PRN

## 2012-06-26 NOTE — Telephone Encounter (Signed)
At the patients 02/26/11 OV she was given a sample of Ventolin HFA.  That is the inhaler she is requesting a refill on.

## 2012-06-26 NOTE — Telephone Encounter (Signed)
Patient needs medical consultation request form filled out.  Will bring that back to you.  Wants form faxed back to [facility].   Patient also is requesting a refill on inhaler.

## 2012-06-26 NOTE — Telephone Encounter (Signed)
No prior inhaler rx on EPIC; please ask pt to be specific  Form done -to robin

## 2012-06-26 NOTE — Telephone Encounter (Signed)
Ok - to robin to handle 

## 2012-07-01 ENCOUNTER — Encounter: Payer: Self-pay | Admitting: Internal Medicine

## 2012-08-14 ENCOUNTER — Encounter: Payer: Self-pay | Admitting: Internal Medicine

## 2012-08-14 ENCOUNTER — Ambulatory Visit (AMBULATORY_SURGERY_CENTER): Payer: BC Managed Care – PPO | Admitting: *Deleted

## 2012-08-14 VITALS — Ht 71.0 in | Wt 259.0 lb

## 2012-08-14 DIAGNOSIS — Z1211 Encounter for screening for malignant neoplasm of colon: Secondary | ICD-10-CM

## 2012-08-14 MED ORDER — NA SULFATE-K SULFATE-MG SULF 17.5-3.13-1.6 GM/177ML PO SOLN: ORAL | Status: DC

## 2012-08-28 ENCOUNTER — Encounter: Payer: Self-pay | Admitting: Internal Medicine

## 2012-08-28 ENCOUNTER — Ambulatory Visit (AMBULATORY_SURGERY_CENTER): Payer: BC Managed Care – PPO | Admitting: Internal Medicine

## 2012-08-28 VITALS — BP 111/76 | HR 75 | Temp 98.5°F | Resp 16 | Ht 71.0 in | Wt 259.0 lb

## 2012-08-28 DIAGNOSIS — Z1211 Encounter for screening for malignant neoplasm of colon: Secondary | ICD-10-CM

## 2012-08-28 DIAGNOSIS — D126 Benign neoplasm of colon, unspecified: Secondary | ICD-10-CM

## 2012-08-28 MED ORDER — SODIUM CHLORIDE 0.9 % IV SOLN: 500.0000 mL | INTRAVENOUS | Status: DC

## 2012-08-28 NOTE — Patient Instructions (Addendum)
I found and removed two small polyps today. They look benign. The remainder of the exam was ok. Good prep.  I will let you know pathology results and when to have another routine colonoscopy by mail.  I appreciate the opportunity to care for you. Iva Boop, MD, FACG    YOU HAD AN ENDOSCOPIC PROCEDURE TODAY AT THE [facility]: Refer to the procedure report that was given to you for any specific questions about what was found during the examination.  If the procedure report does not answer your questions, please call your gastroenterologist to clarify.  If you requested that your care partner not be given the details of your procedure findings, then the procedure report has been included in a sealed envelope for you to review at your convenience later.  YOU SHOULD EXPECT: Some feelings of bloating in the abdomen. Passage of more gas than usual.  Walking can help get rid of the air that was put into your GI tract during the procedure and reduce the bloating. If you had a lower endoscopy (such as a colonoscopy or flexible sigmoidoscopy) you may notice spotting of blood in your stool or on the toilet paper. If you underwent a bowel prep for your procedure, then you may not have a normal bowel movement for a few days.  DIET: Your first meal following the procedure should be a light meal and then it is ok to progress to your normal diet.  A half-sandwich or bowl of soup is an example of a good first meal.  Heavy or fried foods are harder to digest and may make you feel nauseous or bloated.  Likewise meals heavy in dairy and vegetables can cause extra gas to form and this can also increase the bloating.  Drink plenty of fluids but you should avoid alcoholic beverages for 24 hours.  ACTIVITY: Your care partner should take you home directly after the procedure.  You should plan to take it easy, moving slowly for the rest of the day.  You can resume normal activity the day after the procedure  however you should NOT DRIVE or use heavy machinery for 24 hours (because of the sedation medicines used during the test).    SYMPTOMS TO REPORT IMMEDIATELY: A gastroenterologist can be reached at any hour.  During normal business hours, 8:30 AM to 5:00 PM Monday through Friday, call [phone].  After hours and on weekends, please call the GI answering service at [phone] who will take a message and have the physician on call contact you.   Following lower endoscopy (colonoscopy or flexible sigmoidoscopy):  Excessive amounts of blood in the stool  Significant tenderness or worsening of abdominal pains  Swelling of the abdomen that is new, acute  Fever of 100F or higher  FOLLOW UP: If any biopsies were taken you will be contacted by phone or by letter within the next 1-3 weeks.  Call your gastroenterologist if you have not heard about the biopsies in 3 weeks.  Our staff will call the home number listed on your records the next business day following your procedure to check on you and address any questions or concerns that you may have at that time regarding the information given to you following your procedure. This is a courtesy call and so if there is no answer at the home number and we have not heard from you through the emergency physician on call, we will assume that you have returned to your regular  daily activities without incident.  SIGNATURES/CONFIDENTIALITY: You and/or your care partner have signed paperwork which will be entered into your electronic medical record.  These signatures attest to the fact that that the information above on your After Visit Summary has been reviewed and is understood.  Full responsibility of the confidentiality of this discharge information lies with you and/or your care-partner.

## 2012-08-28 NOTE — Progress Notes (Signed)
Procedure ends, to recovery room awkae, report given and VSS.

## 2012-08-28 NOTE — Progress Notes (Signed)
Called to room to assist during endoscopic procedure.  Patient ID and intended procedure confirmed with present staff. Received instructions for my participation in the procedure from the performing physician.  

## 2012-08-28 NOTE — Op Note (Signed)
[facility] [address] [place], [address]   COLONOSCOPY PROCEDURE REPORT  PATIENT: [name], [name].  MR#: [id number] BIRTHDATE: Nov 20, 1960 , 51  yrs. old GENDER: Female ENDOSCOPIST: Iva Boop, MD, [facility] REFERRED PI:RJJOA John, M.D. PROCEDURE DATE:  08/28/2012 PROCEDURE:   Colonoscopy with snare polypectomy ASA CLASS:   Class II INDICATIONS:average risk screening and first colonoscopy. MEDICATIONS: propofol (Diprivan) 300mg  IV, MAC sedation, administered by CRNA, and These medications were titrated to patient response per physician's verbal order  DESCRIPTION OF PROCEDURE:   After the risks benefits and alternatives of the procedure were thoroughly explained, informed consent was obtained.  A digital rectal exam revealed no abnormalities of the rectum.   The LB [id number]  endoscope was introduced through the anus and advanced to the cecum, which was identified by both the appendix and ileocecal valve. No adverse events experienced.   The quality of the prep was Suprep good  The instrument was then slowly withdrawn as the colon was fully examined.      COLON FINDINGS: Two sessile polyps measuring 5-6 mm in size were found in the transverse colon.  A polypectomy was performed with a cold snare.  The resection was complete and the polyp tissue was completely retrieved.   A sessile polyp measuring 3 mm in size was found in the sigmoid colon.  A polypectomy was performed with a cold snare.  The resection was complete and the polyp tissue was completely retrieved.   The colon mucosa was otherwise normal. Retroflexed views revealed no abnormalities. The time to cecum=1 minutes 36 seconds.  Withdrawal time=12 minutes 39 seconds.  The scope was withdrawn and the procedure completed. COMPLICATIONS: There were no complications.  ENDOSCOPIC IMPRESSION: 1.   Two sessile polyps measuring 5-6 mm in size were found in the transverse colon; polypectomy  was performed with a cold snare 2.   Sessile polyp measuring 3 mm in size was found in the sigmoid colon; polypectomy was performed with a cold snare 3.   The colon mucosa was otherwise normal - good prep  RECOMMENDATIONS: Timing of repeat colonoscopy will be determined by pathology findings in this patient having first screening colonoscopy.   eSigned:  Iva Boop, MD, [facility] 08/28/2012 11:22 AM cc: Corwin Levins, MD and The Patient

## 2012-08-28 NOTE — Progress Notes (Signed)
Patient did not have preoperative order for IV antibiotic SSI prophylaxis. (G8918)  Patient did not experience any of the following events: a burn prior to discharge; a fall within the facility; wrong site/side/patient/procedure/implant event; or a hospital transfer or hospital admission upon discharge from the facility. (G8907)  

## 2012-08-31 ENCOUNTER — Telehealth: Payer: Self-pay

## 2012-08-31 NOTE — Telephone Encounter (Signed)
  Follow up Call-  Call back number 08/28/2012  Post procedure Call Back phone  # [phone]  Permission to leave phone message Yes     Patient questions:  Do you have a fever, pain , or abdominal swelling? no Pain Score  0 *  Have you tolerated food without any problems? yes  Have you been able to return to your normal activities? yes  Do you have any questions about your discharge instructions: Diet   no Medications  no Follow up visit  no  Do you have questions or concerns about your Care? no  Actions: * If pain score is 4 or above: No action needed, pain <4.

## 2012-09-02 ENCOUNTER — Encounter: Payer: Self-pay | Admitting: Internal Medicine

## 2012-09-02 DIAGNOSIS — Z860100 Personal history of colon polyps, unspecified: Secondary | ICD-10-CM | POA: Insufficient documentation

## 2012-09-02 HISTORY — DX: Personal history of colonic polyps: Z86.010

## 2012-09-02 HISTORY — DX: Personal history of colon polyps, unspecified: Z86.0100

## 2012-09-02 NOTE — Progress Notes (Signed)
Quick Note:  2 adenomas and sessile serrated polyp Repeat colon about 08/2015 ______

## 2012-10-16 ENCOUNTER — Ambulatory Visit (INDEPENDENT_AMBULATORY_CARE_PROVIDER_SITE_OTHER): Payer: BC Managed Care – PPO | Admitting: Internal Medicine

## 2012-10-16 ENCOUNTER — Encounter: Payer: Self-pay | Admitting: Internal Medicine

## 2012-10-16 VITALS — BP 132/90 | HR 100 | Temp 99.2°F | Resp 18 | Wt 256.8 lb

## 2012-10-16 DIAGNOSIS — J029 Acute pharyngitis, unspecified: Secondary | ICD-10-CM

## 2012-10-16 DIAGNOSIS — R059 Cough, unspecified: Secondary | ICD-10-CM

## 2012-10-16 DIAGNOSIS — R05 Cough: Secondary | ICD-10-CM

## 2012-10-16 MED ORDER — HYDROCODONE-HOMATROPINE 5-1.5 MG/5ML PO SYRP: 5.0000 mL | ORAL_SOLUTION | Freq: Four times a day (QID) | ORAL | Status: DC | PRN

## 2012-10-16 MED ORDER — AZITHROMYCIN 250 MG PO TABS: ORAL_TABLET | ORAL | Status: DC

## 2012-10-16 NOTE — Progress Notes (Signed)
Subjective:    Patient ID: [name], female    DOB: [birth year], 52 y.o.   MRN: [id number]  Cough This is a chronic (worse in past 3 days) problem. The problem has been gradually worsening. The problem occurs every few minutes. The cough is productive of purulent sputum. Associated symptoms include ear congestion, ear pain, heartburn, myalgias, postnasal drip and a sore throat. Pertinent negatives include no fever, headaches, hemoptysis, rash, rhinorrhea, shortness of breath, weight loss or wheezing. The symptoms are aggravated by lying down. She has tried OTC cough suppressant for the symptoms. The treatment provided mild relief. Her past medical history is significant for environmental allergies.  Sore Throat  This is a new problem. The current episode started in the past 7 days. Associated symptoms include coughing, ear pain, a hoarse voice, a plugged ear sensation, neck pain and swollen glands. Pertinent negatives include no diarrhea, ear discharge, headaches, shortness of breath, trouble swallowing or vomiting. She has had no exposure to strep or mono. She has tried acetaminophen and NSAIDs for the symptoms. The treatment provided mild relief.   Past Medical History  Diagnosis Date  . DEPRESSION   . ANXIETY   . HYPERLIPIDEMIA   . ASTHMA   . SYMPTOM, PALPITATIONS   . Headache(784.0)     Migrane  . PAIN, CHRONIC NEC   . Hypertension   . BACK PAIN     Recurrent  . Mitral valve prolapse   . Personal history of colonic adenomas 09/02/2012     Review of Systems  Constitutional: Negative for fever and weight loss.  HENT: Positive for ear pain, sore throat, hoarse voice, neck pain and postnasal drip. Negative for rhinorrhea, trouble swallowing and ear discharge.   Respiratory: Positive for cough. Negative for hemoptysis, shortness of breath and wheezing.   Gastrointestinal: Positive for heartburn. Negative for vomiting and diarrhea.  Musculoskeletal: Positive for myalgias.   Skin: Negative for rash.  Allergic/Immunologic: Positive for environmental allergies.  Neurological: Negative for headaches.       Objective:   Physical Exam BP 132/90  Pulse 100  Temp(Src) 99.2 F (37.3 C)  Resp 18  Wt 256 lb 12.8 oz (116.484 kg)  BMI 35.83 kg/m2  SpO2 98% Wt Readings from Last 3 Encounters:  10/16/12 256 lb 12.8 oz (116.484 kg)  08/28/12 259 lb (117.482 kg)  08/14/12 259 lb (117.482 kg)   Constitutional: She appears well-developed and well-nourished. No distress. Daughter at side HENT: Head: Normocephalic and atraumatic. Ears: B TMs ok, no erythema or effusion; Nose: Nose normal. Mouth/Throat: Oropharynx is clear and moist. No oropharyngeal exudate.  Eyes: Conjunctivae and EOM are normal. Pupils are equal, round, and reactive to light. No scleral icterus.  Neck: Normal range of motion. Neck supple. Mild left greater than right anterior cervical lymphadenopathy. No JVD present. No thyromegaly present.  Cardiovascular: Normal rate, regular rhythm and normal heart sounds.  No murmur heard. No BLE edema. Pulmonary/Chest: Effort normal and breath sounds normal. No respiratory distress. She has no wheezes.  Psychiatric: She has a normal mood and affect. Her behavior is normal. Judgment and thought content normal.   Lab Results  Component Value Date   WBC 7.9 04/06/2012   HGB 14.6 04/06/2012   HCT 42.8 04/06/2012   PLT 206.0 04/06/2012   GLUCOSE 104* 04/06/2012   CHOL 203* 04/06/2012   TRIG 207.0* 04/06/2012   HDL 35.70* 04/06/2012   LDLDIRECT 127.3 04/06/2012   LDLCALC 118* 08/12/2008   ALT 31 04/06/2012  AST 22 04/06/2012   NA 140 04/06/2012   K 4.1 04/06/2012   CL 106 04/06/2012   CREATININE 1.0 04/06/2012   BUN 14 04/06/2012   CO2 26 04/06/2012   TSH 6.60* 04/06/2012        Assessment & Plan:   Acute pharyngitis, suspect viral Cough, acute on chronic, progressive purulent sputum in past 7 days  Will treat with empiric antibiotics given progression of acute  symptoms Symptomatic relief with narcotic cough suppression Advised Tylenol or ibuprofen as needed, hydration and rest, salt gargles as needed Patient agrees to call if worse or unimproved

## 2012-10-16 NOTE — Patient Instructions (Signed)
It was good to see you today.  Zpak antibiotics and prescription cough syrup - Your prescription(s) have been submitted to your pharmacy. Please take as directed and contact our office if you believe you are having problem(s) with the medication(s).  Alternate between ibuprofen and tylenol for aches, pain and fever symptoms as discussed  Hydrate, rest and call if worse or unimproved 

## 2012-10-30 ENCOUNTER — Other Ambulatory Visit: Payer: Self-pay | Admitting: Orthopaedic Surgery

## 2012-10-30 DIAGNOSIS — M545 Low back pain, unspecified: Secondary | ICD-10-CM

## 2012-11-04 ENCOUNTER — Other Ambulatory Visit: Payer: Self-pay

## 2012-11-04 ENCOUNTER — Ambulatory Visit
Admission: RE | Admit: 2012-11-04 | Discharge: 2012-11-04 | Disposition: A | Discharge status: Home / Self Care | Payer: BC Managed Care – PPO | Source: Ambulatory Visit | Attending: Orthopaedic Surgery | Admitting: Orthopaedic Surgery

## 2012-11-04 ENCOUNTER — Other Ambulatory Visit: Payer: Self-pay | Admitting: Internal Medicine

## 2012-11-04 DIAGNOSIS — M545 Low back pain, unspecified: Secondary | ICD-10-CM

## 2012-11-05 MED ORDER — OXYCODONE HCL 5 MG PO TABS: 5.0000 mg | ORAL_TABLET | Freq: Four times a day (QID) | ORAL | Status: DC | PRN

## 2012-11-05 MED ORDER — DULOXETINE HCL 60 MG PO CPEP: 60.0000 mg | ORAL_CAPSULE | Freq: Every day | ORAL | Status: DC

## 2012-11-05 MED ORDER — CYCLOBENZAPRINE HCL 5 MG PO TABS: 5.0000 mg | ORAL_TABLET | Freq: Three times a day (TID) | ORAL | Status: DC | PRN

## 2012-11-05 NOTE — Telephone Encounter (Signed)
Called the patient informed to pickup hardcopy at the front desk. 

## 2013-01-07 ENCOUNTER — Ambulatory Visit (INDEPENDENT_AMBULATORY_CARE_PROVIDER_SITE_OTHER): Payer: BC Managed Care – PPO

## 2013-01-07 DIAGNOSIS — Z23 Encounter for immunization: Secondary | ICD-10-CM

## 2013-01-27 ENCOUNTER — Telehealth: Payer: Self-pay

## 2013-01-27 MED ORDER — ONETOUCH ULTRA SYSTEM W/DEVICE KIT: PACK | Status: DC

## 2013-01-27 MED ORDER — GLUCOSE BLOOD VI STRP: ORAL_STRIP | Status: DC

## 2013-01-27 MED ORDER — LANCETS MISC: Status: DC

## 2013-01-27 NOTE — Telephone Encounter (Signed)
Called the patient to confirm ok to send diabetes testing supplies to [facility] [place], [place].

## 2013-02-08 ENCOUNTER — Telehealth: Payer: Self-pay | Admitting: Internal Medicine

## 2013-02-09 MED ORDER — OXYCODONE HCL 5 MG PO TABS: 5.0000 mg | ORAL_TABLET | Freq: Four times a day (QID) | ORAL | Status: DC | PRN

## 2013-02-09 NOTE — Telephone Encounter (Signed)
Done hardcopy to robin, to also let pt know;    You are given the letter today explaining the transitional pain medication refill policy due to recent change in [place] Law and [place] [facility] regulations  Please be aware that I will no longer be able to offer monthly refills of any Schedule II or higher medication starting Apr 25, 2013

## 2013-02-09 NOTE — Telephone Encounter (Signed)
Called the patient informed hardcopy is ready for pickup at the front desk.  Also did explain enclosed letter regarding change in pain medication policy.

## 2013-02-24 ENCOUNTER — Encounter: Payer: Self-pay | Admitting: Internal Medicine

## 2013-02-24 ENCOUNTER — Ambulatory Visit (INDEPENDENT_AMBULATORY_CARE_PROVIDER_SITE_OTHER): Payer: BC Managed Care – PPO | Admitting: Internal Medicine

## 2013-02-24 DIAGNOSIS — J019 Acute sinusitis, unspecified: Secondary | ICD-10-CM

## 2013-02-24 DIAGNOSIS — M25512 Pain in left shoulder: Secondary | ICD-10-CM

## 2013-02-24 DIAGNOSIS — I10 Essential (primary) hypertension: Secondary | ICD-10-CM

## 2013-02-24 DIAGNOSIS — M25519 Pain in unspecified shoulder: Secondary | ICD-10-CM

## 2013-02-24 MED ORDER — HYDROCODONE-HOMATROPINE 5-1.5 MG/5ML PO SYRP: 5.0000 mL | ORAL_SOLUTION | Freq: Four times a day (QID) | ORAL | Status: DC | PRN

## 2013-02-24 MED ORDER — AMOXICILLIN-POT CLAVULANATE 875-125 MG PO TABS: 1.0000 | ORAL_TABLET | Freq: Two times a day (BID) | ORAL | Status: DC

## 2013-02-24 NOTE — Assessment & Plan Note (Signed)
?   Rot cuff tear vs other- for sports med referral

## 2013-02-24 NOTE — Assessment & Plan Note (Signed)
Mild to mod, for antibx course,  to f/u any worsening symptoms or concerns 

## 2013-02-24 NOTE — Assessment & Plan Note (Signed)
stable overall by history and exam, recent data reviewed with pt, and pt to continue medical treatment as before,  to f/u any worsening symptoms or concerns BP Readings from Last 3 Encounters:  10/16/12 132/90  08/28/12 111/76  05/14/12 140/80

## 2013-02-24 NOTE — Patient Instructions (Signed)
Please take all new medication as prescribed Please continue all other medications as before, and refills have been done if requested.  You will be contacted regarding the referral for: Dr Katrinka Blazing for the left shoulder  Please remember to sign up for My Chart if you have not done so, as this will be important to you in the future with finding out test results, communicating by private email, and scheduling acute appointments online when needed.

## 2013-02-24 NOTE — Progress Notes (Signed)
Pre-visit discussion using our clinic review tool. No additional management support is needed unless otherwise documented below in the visit note.  

## 2013-02-24 NOTE — Progress Notes (Signed)
Subjective:    Patient ID: [name], female    DOB: 23-Feb-1961, 52 y.o.   MRN: [id number]  HPI   Here with 2-3 days acute onset fever, facial pain, pressure, headache, general weakness and malaise, and greenish d/c, with mild ST and cough, but pt denies chest pain, wheezing, increased sob or doe, orthopnea, PND, increased LE swelling, palpitations, dizziness or syncope. Still smoking, not ready to quit.  Also has ongoing left shoudler pain, cant raise left hand above shoulder level.   Past Medical History  Diagnosis Date  . DEPRESSION   . ANXIETY   . HYPERLIPIDEMIA   . ASTHMA   . SYMPTOM, PALPITATIONS   . Headache(784.0)     Migrane  . PAIN, CHRONIC NEC   . Hypertension   . BACK PAIN     Recurrent  . Mitral valve prolapse   . Personal history of colonic adenomas 09/02/2012   Past Surgical History  Procedure Laterality Date  . Cesarean section      x2  . Abdominal hysterectomy      1997  . Appendectomy    . Oophorectomy  1996/2000  . Tonsillectomy    . Foot surgery      Left foot  . Knee surgery  2000    Right Knee-Cartilage Problem  . Cervical fusion  2003    reports that she has been smoking Cigarettes.  She has been smoking about 0.50 packs per day. She has never used smokeless tobacco. She reports that she does not drink alcohol or use illicit drugs. family history includes Arthritis in her father; COPD in her sister; Cancer in her brother, maternal grandmother, and other; Colon cancer (age of onset: 49) in her maternal uncle; Colon cancer (age of onset: 20) in her maternal uncle; Diabetes in her father, sister, and sister; Heart disease in her mother and sister. Allergies  Allergen Reactions  . Aspirin Hives and Shortness Of Breath  . Sulfonamide Derivatives Swelling   Current Outpatient Prescriptions on File Prior to Visit  Medication Sig Dispense Refill  . albuterol (VENTOLIN HFA) 108 (90 BASE) MCG/ACT inhaler Inhale 2 puffs into the lungs every 6 (six)  hours as needed for wheezing.  8.5 Inhaler  6  . ALPRAZolam (XANAX) 0.5 MG tablet Take 0.5 mg by mouth 3 (three) times daily as needed. For anxiety      . atorvastatin (LIPITOR) 10 MG tablet Take 1 tablet (10 mg total) by mouth daily.  90 tablet  3  . azithromycin (ZITHROMAX Z-PAK) 250 MG tablet Take 2 tablets (500 mg) on  Day 1,  followed by 1 tablet (250 mg) once daily on Days 2 through 5.  6 each  0  . Blood Glucose Monitoring Suppl (ONE TOUCH ULTRA SYSTEM KIT) W/DEVICE KIT Use as directed once daily to check blood sugar.  1 each  0  . butalbital-acetaminophen-caffeine (FIORICET, ESGIC) 50-325-40 MG per tablet Take 1 tablet by mouth every 6 (six) hours as needed. For migraines  20 tablet  1  . cyclobenzaprine (FLEXERIL) 5 MG tablet Take 1 tablet (5 mg total) by mouth 3 (three) times daily as needed for muscle spasms.  60 tablet  2  . DULoxetine (CYMBALTA) 60 MG capsule Take 1 capsule (60 mg total) by mouth daily.  90 capsule  1  . Esomeprazole Magnesium (NEXIUM PO) Take by mouth daily.      . fluticasone (FLONASE) 50 MCG/ACT nasal spray Place 2 sprays into the nose daily.  16  g  2  . glucose blood test strip Use as directed once daily to check blood sugar.  100 each  11  . Lancets MISC Use as directed once daily to check blood sugar.  100 each  11  . metoprolol (LOPRESSOR) 50 MG tablet Take 1 tablet (50 mg total) by mouth 2 (two) times daily.  180 tablet  3  . omeprazole (PRILOSEC) 40 MG capsule       . ONE TOUCH LANCETS MISC       . oxyCODONE (OXY IR/ROXICODONE) 5 MG immediate release tablet Take 1 tablet (5 mg total) by mouth every 6 (six) hours as needed.  60 tablet  0   No current facility-administered medications on file prior to visit.   Review of Systems  Constitutional: Negative for unexpected weight change, or unusual diaphoresis  HENT: Negative for tinnitus.   Eyes: Negative for photophobia and visual disturbance.  Respiratory: Negative for choking and stridor.     Gastrointestinal: Negative for vomiting and blood in stool.  Genitourinary: Negative for hematuria and decreased urine volume.  Musculoskeletal: Negative for acute joint swelling Skin: Negative for color change and wound.  Neurological: Negative for tremors and numbness other than noted  Psychiatric/Behavioral: Negative for decreased concentration or  hyperactivity.       Objective:   Physical Exam There were no vitals taken for this visit. VS noted, mild ill Constitutional: Pt appears well-developed and well-nourished.  HENT: Head: NCAT.  Right Ear: External ear normal.  Left Ear: External ear normal.  Bilat tm's with mild erythema.  Max sinus areas mild tender.  Pharynx with mild erythema, no exudate Eyes: Conjunctivae and EOM are normal. Pupils are equal, round, and reactive to light.  Neck: Normal range of motion. Neck supple.  Cardiovascular: Normal rate and regular rhythm.   Pulmonary/Chest: Effort normal and breath sounds normal.  Left shoulder unable to abduct past 90 degrees Neurological: Pt is alert. Not confused  Skin: Skin is warm. No erythema.  Psychiatric: Pt behavior is normal. Thought content normal.     Assessment & Plan:

## 2013-03-04 ENCOUNTER — Other Ambulatory Visit (INDEPENDENT_AMBULATORY_CARE_PROVIDER_SITE_OTHER): Payer: BC Managed Care – PPO

## 2013-03-04 ENCOUNTER — Encounter: Payer: Self-pay | Admitting: Family Medicine

## 2013-03-04 ENCOUNTER — Telehealth: Payer: Self-pay | Admitting: *Deleted

## 2013-03-04 ENCOUNTER — Ambulatory Visit (INDEPENDENT_AMBULATORY_CARE_PROVIDER_SITE_OTHER): Payer: BC Managed Care – PPO | Admitting: Family Medicine

## 2013-03-04 VITALS — BP 118/82 | HR 69

## 2013-03-04 DIAGNOSIS — M75110 Incomplete rotator cuff tear or rupture of unspecified shoulder, not specified as traumatic: Secondary | ICD-10-CM | POA: Insufficient documentation

## 2013-03-04 DIAGNOSIS — M25519 Pain in unspecified shoulder: Secondary | ICD-10-CM

## 2013-03-04 DIAGNOSIS — S43429A Sprain of unspecified rotator cuff capsule, initial encounter: Secondary | ICD-10-CM

## 2013-03-04 DIAGNOSIS — M25512 Pain in left shoulder: Secondary | ICD-10-CM

## 2013-03-04 DIAGNOSIS — M75112 Incomplete rotator cuff tear or rupture of left shoulder, not specified as traumatic: Secondary | ICD-10-CM

## 2013-03-04 MED ORDER — CYCLOBENZAPRINE HCL 5 MG PO TABS: 5.0000 mg | ORAL_TABLET | Freq: Three times a day (TID) | ORAL | Status: DC | PRN

## 2013-03-04 NOTE — Patient Instructions (Signed)
Very nice to meet you Subacromial bursitis with small rotator cuff tear.  Try exercises most days of the week.  Ice 20 minutes after activity.  Come back in 4 weeks.

## 2013-03-04 NOTE — Assessment & Plan Note (Signed)
Patient had injection as described above. Patient tolerated the procedure well and will do home exercise program a regular basis Patient is also going to do icing protocol to Patient will return again in 4 weeks for further evaluation. If not better I would like to get x-rays.

## 2013-03-04 NOTE — Telephone Encounter (Signed)
Done erx 

## 2013-03-04 NOTE — Progress Notes (Signed)
I'm seeing this patient by the request  of:  Oliver Barre, MD   CC: Left shoulder pain  Patient is coming in with a complaint of left shoulder pain. Patient states that she has had this pain for about 2 months. Patient has been going to physical therapy for her back and states that some of the exercises they noticed she was unable to move her left shoulder is much. Patient was sent here for evaluation. Patient states that with certain movements such as reaching above head or behind her body causes significant pain. Patient denies any associated neck pain, any radiation down her arm or any numbness. Patient states that it seems to be localized to her shoulder. Patient does not remember any true injury. Patient describes the pain more as a dull throbbing sensation with sharp pain with certain movements. Patient is taking oxycodone for other pain and states that this is somewhat helpful. Patient is not taking any anti-inflammatories. States that laying on it at night can wake her up. Rates the pain of 8/10   Past medical, surgical, family and social history reviewed. Medications reviewed all in the electronic medical record.   Review of Systems: No headache, visual changes, nausea, vomiting, diarrhea, constipation, dizziness, abdominal pain, skin rash, fevers, chills, night sweats, weight loss, swollen lymph nodes, body aches, joint swelling, muscle aches, chest pain, shortness of breath, mood changes.   Objective:    Blood pressure 118/82, pulse 69, SpO2 97.00%.   General: No apparent distress alert and oriented x3 mood and affect normal, dressed appropriately.  HEENT: Pupils equal, extraocular movements intact Respiratory: Patient's speak in full sentences and does not appear short of breath Cardiovascular: No lower extremity edema, non tender, no erythema Skin: Warm dry intact with no signs of infection or rash on extremities or on axial skeleton. Abdomen: Soft nontender Neuro: Cranial nerves II  through XII are intact, neurovascularly intact in all extremities with 2+ DTRs and 2+ pulses. Lymph: No lymphadenopathy of posterior or anterior cervical chain or axillae bilaterally.  Gait normal with good balance and coordination.  MSK: Non tender with full range of motion and good stability and symmetric strength and tone of  elbows, wrist, hip, knee and ankles bilaterally.  Shoulder: Left Inspection reveals no abnormalities, atrophy or asymmetry. Palpation is normal with no tenderness over AC joint or bicipital groove. ROM is full in all planes. Rotator cuff strength normal throughout. Positive signs of impingement with negative Neer and Hawkin's tests, empty can sign. Speeds and Yergason's tests normal. No labral pathology noted with negative Obrien's, negative clunk and good stability. Normal scapular function observed. No painful arc and no drop arm sign. No apprehension sign Contralateral shoulder unremarkable   MSK US performed of: Left shoulder This study was ordered, performed, and interpreted by Terrilee Files D.O.  Shoulder:   Supraspinatus:  Appears normal on long and transverse views, no bursal bulge seen with shoulder abduction on impingement view. Mild hypoechoic changes Infraspinatus:  Appears normal on long and transverse views. Subscapularis:  Appears has very small articular side tear on long and transverse views. Teres Minor:  Appears normal on long and transverse views. AC joint:  Capsule undistended, no geyser sign. Glenohumeral Joint:  Appears normal but does have trace effusion. Glenoid Labrum:  Intact without visualized tears. Biceps Tendon:  Appears normal on long and transverse views, no fraying of tendon, tendon located in intertubercular groove, no subluxation with shoulder internal or external rotation. No increased power doppler signal.  Impression:  Mild subacromial bursitis rotator cuff tendinopathy with very small articular side tear of the  subscapularis  Procedure: Real-time Ultrasound Guided Injection of left glenohumeral joint Device: GE Logiq E  Ultrasound guided injection is preferred based studies that show increased duration, increased effect, greater accuracy, decreased procedural pain, increased response rate with ultrasound guided versus blind injection.  Verbal informed consent obtained.  Time-out conducted.  Noted no overlying erythema, induration, or other signs of local infection.  Skin prepped in a sterile fashion.  Local anesthesia: Topical Ethyl chloride.  With sterile technique and under real time ultrasound guidance:  Joint visualized.  23g 1  inch needle inserted posterior approach. Pictures taken for needle placement. Patient did have injection of 2 cc of 1% lidocaine, 2 cc of 0.5% Marcaine, and 1cc of Kenalog 40 mg/dL. Completed without difficulty  Pain immediately resolved suggesting accurate placement of the medication.  Advised to call if fevers/chills, erythema, induration, drainage, or persistent bleeding.  Images permanently stored and available for review in the ultrasound unit.  Impression: Technically successful ultrasound guided injection.   Impression and Recommendations:     This case required medical decision making of moderate complexity.

## 2013-03-04 NOTE — Telephone Encounter (Signed)
Pt called requesting Cyclobenzaprine refill.  Please advise

## 2013-03-04 NOTE — Progress Notes (Signed)
Pre-visit discussion using our clinic review tool. No additional management support is needed unless otherwise documented below in the visit note.  

## 2013-03-05 NOTE — Telephone Encounter (Signed)
rx sent

## 2013-03-07 ENCOUNTER — Emergency Department (HOSPITAL_COMMUNITY)
Admission: EM | Admit: 2013-03-07 | Discharge: 2013-03-07 | Disposition: A | Discharge status: Home / Self Care | Payer: BC Managed Care – PPO | Attending: Emergency Medicine | Admitting: Emergency Medicine

## 2013-03-07 ENCOUNTER — Emergency Department (HOSPITAL_COMMUNITY): Payer: BC Managed Care – PPO

## 2013-03-07 ENCOUNTER — Encounter (HOSPITAL_COMMUNITY): Payer: Self-pay | Admitting: Emergency Medicine

## 2013-03-07 DIAGNOSIS — Z9089 Acquired absence of other organs: Secondary | ICD-10-CM | POA: Insufficient documentation

## 2013-03-07 DIAGNOSIS — J45909 Unspecified asthma, uncomplicated: Secondary | ICD-10-CM | POA: Insufficient documentation

## 2013-03-07 DIAGNOSIS — F3289 Other specified depressive episodes: Secondary | ICD-10-CM | POA: Insufficient documentation

## 2013-03-07 DIAGNOSIS — G8929 Other chronic pain: Secondary | ICD-10-CM | POA: Insufficient documentation

## 2013-03-07 DIAGNOSIS — R1013 Epigastric pain: Secondary | ICD-10-CM | POA: Insufficient documentation

## 2013-03-07 DIAGNOSIS — Z8639 Personal history of other endocrine, nutritional and metabolic disease: Secondary | ICD-10-CM | POA: Insufficient documentation

## 2013-03-07 DIAGNOSIS — Z860100 Personal history of colon polyps, unspecified: Secondary | ICD-10-CM | POA: Insufficient documentation

## 2013-03-07 DIAGNOSIS — Z792 Long term (current) use of antibiotics: Secondary | ICD-10-CM | POA: Insufficient documentation

## 2013-03-07 DIAGNOSIS — Z862 Personal history of diseases of the blood and blood-forming organs and certain disorders involving the immune mechanism: Secondary | ICD-10-CM | POA: Insufficient documentation

## 2013-03-07 DIAGNOSIS — R112 Nausea with vomiting, unspecified: Secondary | ICD-10-CM | POA: Insufficient documentation

## 2013-03-07 DIAGNOSIS — Z79899 Other long term (current) drug therapy: Secondary | ICD-10-CM | POA: Insufficient documentation

## 2013-03-07 DIAGNOSIS — F329 Major depressive disorder, single episode, unspecified: Secondary | ICD-10-CM | POA: Insufficient documentation

## 2013-03-07 DIAGNOSIS — R1032 Left lower quadrant pain: Secondary | ICD-10-CM | POA: Insufficient documentation

## 2013-03-07 DIAGNOSIS — R109 Unspecified abdominal pain: Secondary | ICD-10-CM

## 2013-03-07 DIAGNOSIS — F411 Generalized anxiety disorder: Secondary | ICD-10-CM | POA: Insufficient documentation

## 2013-03-07 DIAGNOSIS — F17200 Nicotine dependence, unspecified, uncomplicated: Secondary | ICD-10-CM | POA: Insufficient documentation

## 2013-03-07 DIAGNOSIS — I10 Essential (primary) hypertension: Secondary | ICD-10-CM | POA: Insufficient documentation

## 2013-03-07 LAB — CBC WITH DIFFERENTIAL/PLATELET
Eosinophils Absolute: 0.5 10*3/uL (ref 0.0–0.7)
Eosinophils Relative: 4 % (ref 0–5)
Hemoglobin: 16.6 g/dL — ABNORMAL HIGH (ref 12.0–15.0)
Lymphs Abs: 2 10*3/uL (ref 0.7–4.0)
MCH: 32.6 pg (ref 26.0–34.0)
MCV: 93.1 fL (ref 78.0–100.0)
Monocytes Relative: 12 % (ref 3–12)
Neutro Abs: 8.5 10*3/uL — ABNORMAL HIGH (ref 1.7–7.7)
Neutrophils Relative %: 68 % (ref 43–77)
RBC: 5.09 MIL/uL (ref 3.87–5.11)

## 2013-03-07 LAB — COMPREHENSIVE METABOLIC PANEL
AST: 11 U/L (ref 0–37)
Albumin: 3.8 g/dL (ref 3.5–5.2)
BUN: 14 mg/dL (ref 6–23)
CO2: 22 mEq/L (ref 19–32)
Calcium: 8.6 mg/dL (ref 8.4–10.5)
Chloride: 100 mEq/L (ref 96–112)
Creatinine, Ser: 0.92 mg/dL (ref 0.50–1.10)
Sodium: 136 mEq/L (ref 135–145)

## 2013-03-07 LAB — URINALYSIS, ROUTINE W REFLEX MICROSCOPIC
Glucose, UA: NEGATIVE mg/dL
Hgb urine dipstick: NEGATIVE
Leukocytes, UA: NEGATIVE
Nitrite: NEGATIVE
Protein, ur: NEGATIVE mg/dL
Specific Gravity, Urine: 1.029 (ref 1.005–1.030)
pH: 5.5 (ref 5.0–8.0)

## 2013-03-07 LAB — POCT I-STAT TROPONIN I: Troponin i, poc: 0.01 ng/mL (ref 0.00–0.08)

## 2013-03-07 MED ORDER — CIPROFLOXACIN HCL 500 MG PO TABS: 500.0000 mg | ORAL_TABLET | Freq: Two times a day (BID) | ORAL | Status: DC

## 2013-03-07 MED ORDER — HYDROMORPHONE HCL PF 1 MG/ML IJ SOLN
1.0000 mg | Freq: Once | INTRAMUSCULAR | Status: AC
  Administered 2013-03-07: 1 mg via INTRAVENOUS
  Filled 2013-03-07: qty 1

## 2013-03-07 MED ORDER — HYDROMORPHONE HCL PF 2 MG/ML IJ SOLN: 2.0000 mg | Freq: Once | INTRAMUSCULAR | Status: DC

## 2013-03-07 MED ORDER — ONDANSETRON 4 MG PO TBDP: 4.0000 mg | ORAL_TABLET | Freq: Once | ORAL | Status: DC

## 2013-03-07 MED ORDER — ONDANSETRON 4 MG PO TBDP: 4.0000 mg | ORAL_TABLET | Freq: Three times a day (TID) | ORAL | Status: DC | PRN

## 2013-03-07 MED ORDER — HYDROCODONE-ACETAMINOPHEN 5-325 MG PO TABS: ORAL_TABLET | ORAL | Status: DC

## 2013-03-07 MED ORDER — ONDANSETRON HCL 4 MG/2ML IJ SOLN
4.0000 mg | Freq: Once | INTRAMUSCULAR | Status: AC
  Administered 2013-03-07: 4 mg via INTRAVENOUS
  Filled 2013-03-07: qty 2

## 2013-03-07 MED ORDER — GI COCKTAIL ~~LOC~~
30.0000 mL | Freq: Once | ORAL | Status: AC
  Administered 2013-03-07: 30 mL via ORAL
  Filled 2013-03-07: qty 30

## 2013-03-07 MED ORDER — SODIUM CHLORIDE 0.9 % IV BOLUS (SEPSIS)
1000.0000 mL | Freq: Once | INTRAVENOUS | Status: AC
  Administered 2013-03-07: 1000 mL via INTRAVENOUS

## 2013-03-07 MED ORDER — CIPROFLOXACIN HCL 500 MG PO TABS
500.0000 mg | ORAL_TABLET | Freq: Once | ORAL | Status: AC
  Administered 2013-03-07: 500 mg via ORAL
  Filled 2013-03-07: qty 1

## 2013-03-07 MED ORDER — METRONIDAZOLE 500 MG PO TABS
500.0000 mg | ORAL_TABLET | Freq: Once | ORAL | Status: AC
  Administered 2013-03-07: 500 mg via ORAL
  Filled 2013-03-07: qty 1

## 2013-03-07 MED ORDER — METRONIDAZOLE 500 MG PO TABS: 500.0000 mg | ORAL_TABLET | Freq: Three times a day (TID) | ORAL | Status: DC

## 2013-03-07 NOTE — ED Notes (Signed)
Assumed care of patient Patient reports that she still feels "horrible" and has c/o intermittent "waves" of nausea Patient offered to have MD made aware, so that a PRN medication order could be placed--patient declined Patient stated "I'd rather hold off for now" Patient asking for ice chips--will make MD aware Patient and pt's family members declined further needs at this time Side rails up, call bell in reach

## 2013-03-07 NOTE — ED Notes (Signed)
Pt c/o upper abd pain that radiates to the left, n/v and loose stools that started yesterday. Pt staes she has vomited twice.

## 2013-03-07 NOTE — ED Provider Notes (Signed)
CSN: [id number]     Arrival date & time 03/07/13  1505 History   First MD Initiated Contact with Patient 03/07/13 1606     Chief Complaint  Patient presents with  . Abdominal Pain  . Emesis  . Nausea   (Consider location/radiation/quality/duration/timing/severity/associated sxs/prior Treatment) HPI Comments: Patient with h/o appendectomy -- presents with acute onset of upper abd pain, N/V since yesterday. Pain is R upper, radiating across epigastrium to L upper abd. Pain waxes and wanes like contractions. No radiation to back or shoulders. No CP or SOB. No fever, chills. Patient has only been able to keep down tablespoons of fluid. She is passing gas and had normal BM since pain began. No urinary symptoms. No previous similar pain. Aggravating factors: palpation, movement. Alleviating factors: none. Recently started amitriptyline for lower back pain and is finishing course of augmentin for ear infection.    The history is provided by the patient and medical records.    Past Medical History  Diagnosis Date  . DEPRESSION   . ANXIETY   . HYPERLIPIDEMIA   . ASTHMA   . SYMPTOM, PALPITATIONS   . Headache(784.0)     Migrane  . PAIN, CHRONIC NEC   . Hypertension   . BACK PAIN     Recurrent  . Mitral valve prolapse   . Personal history of colonic adenomas 09/02/2012   Past Surgical History  Procedure Laterality Date  . Cesarean section      x2  . Abdominal hysterectomy      1997  . Appendectomy    . Oophorectomy  1996/2000  . Tonsillectomy    . Foot surgery      Left foot  . Knee surgery  2000    Right Knee-Cartilage Problem  . Cervical fusion  2003   Family History  Problem Relation Age of Onset  . Heart disease Mother     CAD/CABG, died of heart attack  . Cancer Other     Colon Cancer  . Arthritis Father   . Diabetes Father   . Diabetes Sister   . COPD Sister   . Cancer Brother     thyroid cancer  . Cancer Maternal Grandmother     ovarian cancer  . Diabetes  Sister   . Heart disease Sister   . Colon cancer Maternal Uncle 57  . Colon cancer Maternal Uncle 51   History  Substance Use Topics  . Smoking status: Current Every Day Smoker -- 0.50 packs/day    Types: Cigarettes  . Smokeless tobacco: Never Used  . Alcohol Use: No   OB History   Grav Para Term Preterm Abortions TAB SAB Ect Mult Living                 Review of Systems  Constitutional: Negative for fever.  HENT: Negative for rhinorrhea and sore throat.   Eyes: Negative for redness.  Respiratory: Negative for cough and shortness of breath.   Cardiovascular: Negative for chest pain.  Gastrointestinal: Positive for nausea, vomiting and abdominal pain. Negative for diarrhea and blood in stool.  Genitourinary: Negative for dysuria.  Musculoskeletal: Negative for myalgias.  Skin: Negative for rash.  Neurological: Negative for headaches.    Allergies  Aspirin and Sulfonamide derivatives  Home Medications   Current Outpatient Rx  Name  Route  Sig  Dispense  Refill  . ALPRAZolam (XANAX) 0.5 MG tablet   Oral   Take 0.5 mg by mouth 3 (three) times daily as needed. For anxiety         .  amitriptyline (ELAVIL) 10 MG tablet   Oral   Take 1 tablet by mouth 3 (three) times daily.         Marland Kitchen amoxicillin-clavulanate (AUGMENTIN) 875-125 MG per tablet   Oral   Take 1 tablet by mouth 2 (two) times daily.   20 tablet   0   . butalbital-acetaminophen-caffeine (FIORICET, ESGIC) 50-325-40 MG per tablet   Oral   Take 1 tablet by mouth every 6 (six) hours as needed. For migraines   20 tablet   1   . cyclobenzaprine (FLEXERIL) 5 MG tablet   Oral   Take 1 tablet (5 mg total) by mouth 3 (three) times daily as needed for muscle spasms.   60 tablet   2   . DULoxetine (CYMBALTA) 60 MG capsule   Oral   Take 1 capsule (60 mg total) by mouth daily.   90 capsule   1   . gabapentin (NEURONTIN) 300 MG capsule   Oral   Take 1 capsule by mouth 3 (three) times daily.         Marland Kitchen  HYDROcodone-homatropine (HYDROMET) 5-1.5 MG/5ML syrup   Oral   Take 5 mLs by mouth every 6 (six) hours as needed for cough.   200 mL   0   . metoprolol (LOPRESSOR) 50 MG tablet   Oral   Take 1 tablet (50 mg total) by mouth 2 (two) times daily.   180 tablet   3   . omeprazole (PRILOSEC) 40 MG capsule   Oral   Take 40 mg by mouth daily.          Marland Kitchen oxyCODONE (OXY IR/ROXICODONE) 5 MG immediate release tablet   Oral   Take 1 tablet (5 mg total) by mouth every 6 (six) hours as needed.   60 tablet   0   . albuterol (VENTOLIN HFA) 108 (90 BASE) MCG/ACT inhaler   Inhalation   Inhale 2 puffs into the lungs every 6 (six) hours as needed for wheezing.   8.5 Inhaler   6    BP 145/88  Pulse 75  Temp(Src) 97.9 F (36.6 C) (Oral)  Resp 16  SpO2 96% Physical Exam  Nursing note and vitals reviewed. Constitutional: She appears well-developed and well-nourished.  HENT:  Head: Normocephalic and atraumatic.  Eyes: Conjunctivae are normal. Right eye exhibits no discharge. Left eye exhibits no discharge.  Neck: Normal range of motion. Neck supple.  Cardiovascular: Normal rate, regular rhythm and normal heart sounds.   Pulmonary/Chest: Effort normal and breath sounds normal.  Abdominal: Soft. She exhibits no distension. There is tenderness. There is guarding (voluntary) and positive Murphy's sign. There is no rigidity, no rebound, no CVA tenderness and no tenderness at McBurney's point.    Neurological: She is alert.  Skin: Skin is warm and dry.  Psychiatric: She has a normal mood and affect.    ED Course  Procedures (including critical care time) Labs Review Labs Reviewed  CBC WITH DIFFERENTIAL - Abnormal; Notable for the following:    WBC 12.5 (*)    Hemoglobin 16.6 (*)    HCT 47.4 (*)    Neutro Abs 8.5 (*)    Monocytes Absolute 1.4 (*)    All other components within normal limits  COMPREHENSIVE METABOLIC PANEL - Abnormal; Notable for the following:    Glucose, Bld 112 (*)     GFR calc non Af Amer 71 (*)    GFR calc Af Amer 82 (*)    All other components within  normal limits  LIPASE, BLOOD  URINALYSIS, ROUTINE W REFLEX MICROSCOPIC  POCT I-STAT TROPONIN I   Imaging Review US Abdomen Complete  03/07/2013   CLINICAL DATA:  Right upper quadrant pain. Evaluate gallbladder. Cholelithiasis or cholecystitis.  EXAM: ULTRASOUND ABDOMEN COMPLETE  COMPARISON:  None.  FINDINGS: Gallbladder:  No gallstones or wall thickening visualized. No sonographic Murphy sign noted.  Common bile duct:  Diameter: 4 mm, normal.  Liver:  Heterogeneous echotexture. Hyperechoic when compared to the adjacent right kidney compatible with hepatosteatosis.  IVC:  No abnormality visualized.  Pancreas:  Visualized portion unremarkable.  Spleen:  9.4 cm.  Normal echotexture.  Right Kidney:  Length: 13.3 cm. Echogenicity within normal limits. No mass or hydronephrosis visualized.  Left Kidney:  Length: 13.5 cm. Inferior pole difficult to visualize. Echogenicity within normal limits. No mass or hydronephrosis visualized.  Abdominal aorta:  No aneurysm visualized.  Other findings:  None.  IMPRESSION: No acute abnormality. Normal appearance of the gallbladder. Negative for cholelithiasis or cholecystitis. Echogenic liver compatible with hepatosteatosis.   Electronically Signed   By: Andreas Newport M.D.   On: 03/07/2013 19:14   Dg Abd Acute W/chest  03/07/2013   CLINICAL DATA:  abdominal pain.  Vomiting.  EXAM: ACUTE ABDOMEN SERIES (ABDOMEN 2 VIEW & CHEST 1 VIEW)  COMPARISON:  Chest radiograph 10/28/2011.  FINDINGS: There is no evidence of dilated bowel loops or free intraperitoneal air. No radiopaque calculi or other significant radiographic abnormality is seen. Heart size and mediastinal contours are within normal limits. Both lungs are clear.  IMPRESSION: Negative abdominal radiographs.  No acute cardiopulmonary disease.   Electronically Signed   By: Andreas Newport M.D.   On: 03/07/2013 17:21    EKG  Interpretation   None      4:14 PM Patient seen and examined. Work-up initiated. Medications ordered. Concern for SBO vs cholecystitis. Abd x-ray ordered to be followed by [place] if normal.   Vital signs reviewed and are as follows: Filed Vitals:   03/07/13 1520  BP: 145/88  Pulse: 75  Temp: 97.9 F (36.6 C)  Resp: 16   5:57 PM Pain improved. Updated on results. [place] pending. Temp 99.56F.   7:53 PM Results reviewed with Dr. Loretha Stapler who has seen. Will have patient drink. Will likely treat presumptive diverticulitis/colitis with cipro/flagyl. Given her well appearance, do not feel CT needed at this point.   Pt tolerated fluids well. Discussed findings. Pt is feeling better. She is agreeable to treatment with Cipro/Flagyl and states she can follow-up with Dr. Jonny Ruiz in 2 days.   Discussed need to return with changing or worsening symptoms. The patient was urged to return to the Emergency Department immediately with worsening of current symptoms, worsening abdominal pain, persistent vomiting, blood noted in stools, fever, or any other concerns. The patient verbalized understanding.   Patient counseled on use of narcotic pain medications. Counseled not to combine these medications with others containing tylenol. Urged not to drink alcohol, drive, or perform any other activities that requires focus while taking these medications. The patient verbalizes understanding and agrees with the plan.    MDM   1. Abdominal pain    Patient with upper abdominal pain. Symptoms improved in ED. X-ray does not demonstrate obstruction or free air. Labs reassuring, mild leukocytosis. [place] does not show cholecystitis/lithiasis, aortic issue. No pancreatitis. At this point, most likely dx colitis/diverticulitis. Patient appears well enough that CT scan deferred. Will treat and have patient f/u with PCP. Appropriate return instructions given.  Renne Crigler, PA-C 03/08/13 [phone]

## 2013-03-07 NOTE — ED Notes (Signed)
GI cocktail given, see MAR Patient given ice and ice water for PO challenge Will monitor patient

## 2013-03-09 NOTE — ED Provider Notes (Signed)
Medical screening examination/treatment/procedure(s) were conducted as a shared visit with non-physician practitioner(s) and myself.  I personally evaluated the patient during the encounter.  EKG Interpretation   None       52 yo female with abdominal pain.  Associated with vomiting and loose stools.  On my exam, most tender in LUQ with some tenderness in epigastrium and LLQ.  No rigidity, rebound, or guarding.  Vitals stable and she had a nontoxic appearance.  Labs and Abd US unremarkable.  Symptoms and exam are consistent with diverticulitis.  Will initiate treatment.  She has good outpatient follow up for recheck.  Return precautions given.  Clinical Impression: 1. Abdominal pain       Candyce Churn, MD 03/09/13 [phone]

## 2013-03-10 NOTE — ED Provider Notes (Signed)
Medical screening examination/treatment/procedure(s) were conducted as a shared visit with non-physician practitioner(s) and myself.  I personally evaluated the patient during the encounter.   Please see my separate note.     Candyce Churn, MD 03/10/13 [phone]

## 2013-04-01 ENCOUNTER — Ambulatory Visit: Payer: BC Managed Care – PPO | Admitting: Family Medicine

## 2013-04-01 DIAGNOSIS — Z0289 Encounter for other administrative examinations: Secondary | ICD-10-CM

## 2013-04-19 ENCOUNTER — Telehealth: Payer: Self-pay | Admitting: Internal Medicine

## 2013-04-20 MED ORDER — OXYCODONE HCL 5 MG PO TABS: 5.0000 mg | ORAL_TABLET | Freq: Four times a day (QID) | ORAL | Status: DC | PRN

## 2013-04-20 NOTE — Telephone Encounter (Signed)
Done hardcopy to robin  Please remind pt, this would have to be the last rx for the oxycodone, as related in the letter regarding schedule II medications from last november

## 2013-04-20 NOTE — Telephone Encounter (Signed)
Called the patient informed hardcopy is ready for pickup at the front desk and informed of MD instructions on future refills.

## 2013-06-15 ENCOUNTER — Encounter: Payer: Self-pay | Admitting: Internal Medicine

## 2013-06-15 ENCOUNTER — Ambulatory Visit (INDEPENDENT_AMBULATORY_CARE_PROVIDER_SITE_OTHER): Payer: BC Managed Care – PPO | Admitting: Internal Medicine

## 2013-06-15 VITALS — BP 130/60 | HR 86 | Temp 97.0°F

## 2013-06-15 DIAGNOSIS — G250 Essential tremor: Secondary | ICD-10-CM

## 2013-06-15 DIAGNOSIS — J019 Acute sinusitis, unspecified: Secondary | ICD-10-CM

## 2013-06-15 DIAGNOSIS — G252 Other specified forms of tremor: Secondary | ICD-10-CM

## 2013-06-15 MED ORDER — AMOXICILLIN-POT CLAVULANATE 875-125 MG PO TABS: 1.0000 | ORAL_TABLET | Freq: Two times a day (BID) | ORAL | Status: DC

## 2013-06-15 NOTE — Patient Instructions (Signed)
1. Recurrent acute sinus infection right maxillary the worst.   Plan  Augment 875 mg twice a day for 7 days  Flonase 1 spray to each nostril twice a day - now available over the counter  Fluiids  Robitussin DM for cough if needed  \2. Tremor - a high frequency tremor that is suggestive of benign essential tremor (familial tremor) but you also have some dis-coordination on exam and minimal tongue tremor.  Plan Referral to neurology for further evaluation.   Sinusitis Sinusitis is redness, soreness, and swelling (inflammation) of the paranasal sinuses. Paranasal sinuses are air pockets within the bones of your face (beneath the eyes, the middle of the forehead, or above the eyes). In healthy paranasal sinuses, mucus is able to drain out, and air is able to circulate through them by way of your nose. However, when your paranasal sinuses are inflamed, mucus and air can become trapped. This can allow bacteria and other germs to grow and cause infection. Sinusitis can develop quickly and last only a short time (acute) or continue over a long period (chronic). Sinusitis that lasts for more than 12 weeks is considered chronic.  CAUSES  Causes of sinusitis include:  Allergies.  Structural abnormalities, such as displacement of the cartilage that separates your nostrils (deviated septum), which can decrease the air flow through your nose and sinuses and affect sinus drainage.  Functional abnormalities, such as when the small hairs (cilia) that line your sinuses and help remove mucus do not work properly or are not present. SYMPTOMS  Symptoms of acute and chronic sinusitis are the same. The primary symptoms are pain and pressure around the affected sinuses. Other symptoms include:  Upper toothache.  Earache.  Headache.  Bad breath.  Decreased sense of smell and taste.  A cough, which worsens when you are lying flat.  Fatigue.  Fever.  Thick drainage from your nose, which often is green  and may contain pus (purulent).  Swelling and warmth over the affected sinuses. DIAGNOSIS  Your caregiver will perform a physical exam. During the exam, your caregiver may:  Look in your nose for signs of abnormal growths in your nostrils (nasal polyps).  Tap over the affected sinus to check for signs of infection.  View the inside of your sinuses (endoscopy) with a special imaging device with a light attached (endoscope), which is inserted into your sinuses. If your caregiver suspects that you have chronic sinusitis, one or more of the following tests may be recommended:  Allergy tests.  Nasal culture A sample of mucus is taken from your nose and sent to a lab and screened for bacteria.  Nasal cytology A sample of mucus is taken from your nose and examined by your caregiver to determine if your sinusitis is related to an allergy. TREATMENT  Most cases of acute sinusitis are related to a viral infection and will resolve on their own within 10 days. Sometimes medicines are prescribed to help relieve symptoms (pain medicine, decongestants, nasal steroid sprays, or saline sprays).  However, for sinusitis related to a bacterial infection, your caregiver will prescribe antibiotic medicines. These are medicines that will help kill the bacteria causing the infection.  Rarely, sinusitis is caused by a fungal infection. In theses cases, your caregiver will prescribe antifungal medicine. For some cases of chronic sinusitis, surgery is needed. Generally, these are cases in which sinusitis recurs more than 3 times per year, despite other treatments. HOME CARE INSTRUCTIONS   Drink plenty of water. Water helps  thin the mucus so your sinuses can drain more easily.  Use a humidifier.  Inhale steam 3 to 4 times a day (for example, sit in the bathroom with the shower running).  Apply a warm, moist washcloth to your face 3 to 4 times a day, or as directed by your caregiver.  Use saline nasal sprays to  help moisten and clean your sinuses.  Take over-the-counter or prescription medicines for pain, discomfort, or fever only as directed by your caregiver. SEEK IMMEDIATE MEDICAL CARE IF:  You have increasing pain or severe headaches.  You have nausea, vomiting, or drowsiness.  You have swelling around your face.  You have vision problems.  You have a stiff neck.  You have difficulty breathing. MAKE SURE YOU:   Understand these instructions.  Will watch your condition.  Will get help right away if you are not doing well or get worse. Document Released: 03/11/2005 Document Revised: 06/03/2011 Document Reviewed: 03/26/2011 [facility] Patient Information 2014 [place], [place].    Tremor Tremor is a rhythmic, involuntary muscular contraction characterized by oscillations (to-and-fro movements) of a part of the body. The most common of all involuntary movements, tremor can affect various body parts such as the hands, head, facial structures, vocal cords, trunk, and legs; most tremors, however, occur in the hands. Tremor often accompanies neurological disorders associated with aging. Although the disorder is not life-threatening, it can be responsible for functional disability and social embarrassment. TREATMENT  There are many types of tremor and several ways in which tremor is classified. The most common classification is by behavioral context or position. There are five categories of tremor within this classification: resting, postural, kinetic, task-specific, and psychogenic. Resting or static tremor occurs when the muscle is at rest, for example when the hands are lying on the lap. This type of tremor is often seen in patients with Parkinson's disease. Postural tremor occurs when a patient attempts to maintain posture, such as holding the hands outstretched. Postural tremors include physiological tremor, essential tremor, tremor with basal ganglia disease (also seen in patients with  Parkinson's disease), cerebellar postural tremor, tremor with peripheral neuropathy, post-traumatic tremor, and alcoholic tremor. Kinetic or intention (action) tremor occurs during purposeful movement, for example during finger-to-nose testing. Task-specific tremor appears when performing goal-oriented tasks such as handwriting, speaking, or standing. This group consists of primary writing tremor, vocal tremor, and orthostatic tremor. Psychogenic tremor occurs in both older and younger patients. The key feature of this tremor is that it dramatically lessens or disappears when the patient is distracted. PROGNOSIS There are some treatment options available for tremor; the appropriate treatment depends on accurate diagnosis of the cause. Some tremors respond to treatment of the underlying condition, for example in some cases of psychogenic tremor treating the patient's underlying mental problem may cause the tremor to disappear. Also, patients with tremor due to Parkinson's disease may be treated with Levodopa drug therapy. Symptomatic drug therapy is available for several other tremors as well. For those cases of tremor in which there is no effective drug treatment, physical measures such as teaching the patient to brace the affected limb during the tremor are sometimes useful. Surgical intervention such as thalamotomy or deep brain stimulation may be useful in certain cases. Document Released: 03/01/2002 Document Revised: 06/03/2011 Document Reviewed: 03/11/2005 [facility] Patient Information 2014 [place].

## 2013-06-15 NOTE — Progress Notes (Signed)
Pre visit review using our clinic review tool, if applicable. No additional management support is needed unless otherwise documented below in the visit note. 
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
## 2013-06-15 NOTE — Assessment & Plan Note (Signed)
Patient reports onset of tremor January '15. Has high frequency tremor, abnormal rapid finger movement left, abnormal dysdiadochokinesia left hand, abnormal heel shin with left leg and subtle tongue fasciculations.  Plan Refer to neurology

## 2013-06-15 NOTE — Assessment & Plan Note (Signed)
Recurrent acute sinus infection right maxillary the worst.   Plan  Augment 875 mg twice a day for 7 days  Flonase 1 spray to each nostril twice a day - now available over the counter  Fluiids  Robitussin DM for cough if needed

## 2013-06-16 ENCOUNTER — Telehealth: Payer: Self-pay | Admitting: Internal Medicine

## 2013-06-16 NOTE — Telephone Encounter (Signed)
Relevant patient education assigned to patient using Emmi. ° °

## 2013-06-28 ENCOUNTER — Other Ambulatory Visit: Payer: Self-pay | Admitting: Internal Medicine

## 2013-06-28 ENCOUNTER — Ambulatory Visit (INDEPENDENT_AMBULATORY_CARE_PROVIDER_SITE_OTHER): Payer: BC Managed Care – PPO | Admitting: Neurology

## 2013-06-28 ENCOUNTER — Encounter: Payer: Self-pay | Admitting: Neurology

## 2013-06-28 VITALS — BP 122/76 | HR 84 | Resp 20 | Ht 70.0 in | Wt 272.0 lb

## 2013-06-28 DIAGNOSIS — E039 Hypothyroidism, unspecified: Secondary | ICD-10-CM

## 2013-06-28 DIAGNOSIS — F329 Major depressive disorder, single episode, unspecified: Secondary | ICD-10-CM

## 2013-06-28 DIAGNOSIS — R259 Unspecified abnormal involuntary movements: Secondary | ICD-10-CM

## 2013-06-28 DIAGNOSIS — E538 Deficiency of other specified B group vitamins: Secondary | ICD-10-CM

## 2013-06-28 DIAGNOSIS — R413 Other amnesia: Secondary | ICD-10-CM

## 2013-06-28 DIAGNOSIS — F3289 Other specified depressive episodes: Secondary | ICD-10-CM

## 2013-06-28 DIAGNOSIS — R5383 Other fatigue: Secondary | ICD-10-CM

## 2013-06-28 DIAGNOSIS — H02409 Unspecified ptosis of unspecified eyelid: Secondary | ICD-10-CM

## 2013-06-28 DIAGNOSIS — R251 Tremor, unspecified: Secondary | ICD-10-CM

## 2013-06-28 DIAGNOSIS — F32A Depression, unspecified: Secondary | ICD-10-CM

## 2013-06-28 DIAGNOSIS — R5381 Other malaise: Secondary | ICD-10-CM

## 2013-06-28 LAB — CBC WITH DIFFERENTIAL/PLATELET
BASOS ABS: 0.1 10*3/uL (ref 0.0–0.1)
Basophils Relative: 1 % (ref 0–1)
Eosinophils Absolute: 0.8 10*3/uL — ABNORMAL HIGH (ref 0.0–0.7)
Eosinophils Relative: 7 % — ABNORMAL HIGH (ref 0–5)
HEMATOCRIT: 45.9 % (ref 36.0–46.0)
Hemoglobin: 14.7 g/dL (ref 12.0–15.0)
LYMPHS PCT: 29 % (ref 12–46)
Lymphs Abs: 3.2 10*3/uL (ref 0.7–4.0)
MCH: 32.2 pg (ref 26.0–34.0)
MCHC: 32 g/dL (ref 30.0–36.0)
MCV: 100.4 fL — ABNORMAL HIGH (ref 78.0–100.0)
MONO ABS: 0.7 10*3/uL (ref 0.1–1.0)
Monocytes Relative: 6 % (ref 3–12)
NEUTROS ABS: 6.2 10*3/uL (ref 1.7–7.7)
Neutrophils Relative %: 57 % (ref 43–77)
PLATELETS: 242 10*3/uL (ref 150–400)
RBC: 4.57 MIL/uL (ref 3.87–5.11)
RDW: 13.1 % (ref 11.5–15.5)
WBC: 10.9 10*3/uL — AB (ref 4.0–10.5)
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
## 2013-06-28 NOTE — Patient Instructions (Signed)
1. Your provider has requested that you have labwork completed today. Please go to [facility] on the first floor of this building before leaving the office today. 2. Schedule EMG LLE/LUE

## 2013-06-29 ENCOUNTER — Telehealth: Payer: Self-pay | Admitting: Internal Medicine

## 2013-06-29 LAB — COMPREHENSIVE METABOLIC PANEL
ALBUMIN: 4.1 g/dL (ref 3.5–5.2)
ALT: 31 U/L (ref 0–35)
AST: 20 U/L (ref 0–37)
Alkaline Phosphatase: 64 U/L (ref 39–117)
BUN: 8 mg/dL (ref 6–23)
CHLORIDE: 101 meq/L (ref 96–112)
CO2: 28 mEq/L (ref 19–32)
Calcium: 9.3 mg/dL (ref 8.4–10.5)
Creat: 0.88 mg/dL (ref 0.50–1.10)
Glucose, Bld: 92 mg/dL (ref 70–99)
POTASSIUM: 4.3 meq/L (ref 3.5–5.3)
Sodium: 139 mEq/L (ref 135–145)
Total Bilirubin: 0.6 mg/dL (ref 0.2–1.2)
Total Protein: 6.4 g/dL (ref 6.0–8.3)

## 2013-06-29 LAB — SYPHILIS: RPR W/REFLEX TO RPR TITER AND TREPONEMAL ANTIBODIES, TRADITIONAL SCREENING AND DIAGNOSIS ALGORITHM

## 2013-06-29 LAB — TSH: TSH: 3.743 u[IU]/mL (ref 0.350–4.500)

## 2013-06-29 LAB — FOLATE: Folate: 12.7 ng/mL

## 2013-06-29 LAB — VITAMIN B12: Vitamin B-12: 315 pg/mL (ref 211–911)

## 2013-06-29 MED ORDER — DULOXETINE HCL 60 MG PO CPEP: 60.0000 mg | ORAL_CAPSULE | Freq: Every day | ORAL | Status: DC

## 2013-06-29 NOTE — Telephone Encounter (Signed)
Called the patient informed prescription requested has been sent in. 

## 2013-06-29 NOTE — Telephone Encounter (Signed)
Ok for cymbatla according to message; - robin to let pt know  ----- Message ----- From: Tiffany Norse Morejon Sent: 06/28/2013 9:59 PM To: [facility] Subject: Medication Renewal Request Original authorizing provider: Cathlean Cower, MD [name] would like a refill of the following medications: oxyCODONE (OXY IR/ROXICODONE) 5 MG immediate release tablet Cathlean Cower, MD] Preferred pharmacy: [facility] [place] - [address] Comment: No oxycodone, just Duloxetine 60MG . It wasn't an option listed here.

## 2013-07-03 LAB — MYASTHENIA GRAVIS PANEL 2
Acetylcholine Rec Binding: <0.3 nmol/L
Acetylcholine Rec Mod Ab: <1 %
Aceytlcholine Rec Bloc Ab: <15 % of inhibition (ref <15)

## 2013-08-19 ENCOUNTER — Telehealth: Payer: Self-pay | Admitting: Neurology

## 2013-08-19 DIAGNOSIS — R202 Paresthesia of skin: Secondary | ICD-10-CM

## 2013-08-19 NOTE — Telephone Encounter (Signed)
Order entered

## 2013-08-19 NOTE — Telephone Encounter (Signed)
Message copied by Annamaria Helling on Thu Aug 19, 2013  1:10 PM ------      Message from: Hulda Humphrey R      Created: Thu Aug 19, 2013 11:28 AM       Need order for EMG/NCV 08/23/13 ------

## 2013-08-20 ENCOUNTER — Other Ambulatory Visit: Payer: Self-pay | Admitting: Orthopaedic Surgery

## 2013-08-20 DIAGNOSIS — M545 Low back pain, unspecified: Secondary | ICD-10-CM

## 2013-08-23 ENCOUNTER — Encounter: Payer: Self-pay | Admitting: Neurology

## 2013-08-23 ENCOUNTER — Ambulatory Visit
Admission: RE | Admit: 2013-08-23 | Discharge: 2013-08-23 | Disposition: A | Discharge status: Home / Self Care | Payer: BC Managed Care – PPO | Source: Ambulatory Visit | Attending: Orthopaedic Surgery | Admitting: Orthopaedic Surgery

## 2013-08-23 ENCOUNTER — Ambulatory Visit (INDEPENDENT_AMBULATORY_CARE_PROVIDER_SITE_OTHER): Payer: BC Managed Care – PPO | Admitting: Neurology

## 2013-08-23 DIAGNOSIS — M5417 Radiculopathy, lumbosacral region: Secondary | ICD-10-CM

## 2013-08-23 DIAGNOSIS — M545 Low back pain, unspecified: Secondary | ICD-10-CM

## 2013-08-23 DIAGNOSIS — R202 Paresthesia of skin: Secondary | ICD-10-CM

## 2013-08-23 DIAGNOSIS — IMO0002 Reserved for concepts with insufficient information to code with codable children: Secondary | ICD-10-CM

## 2013-08-23 DIAGNOSIS — R209 Unspecified disturbances of skin sensation: Secondary | ICD-10-CM

## 2013-08-23 NOTE — Procedures (Signed)
[facility] [address], [place], [place]  [address] Phone [phone]  Fax [phone]    Patient [name]  Chart Number [id number] Birth Date September 28, 1960  Age 53 y Physician Dr. Posey Pronto  Gender Female Ref. Phys. Dr. Deno Etienne Date Aug 23, 2013     Patient History This is a 53 year old female with paresthesias of the left arm and leg.  NCV & EMG Findings: Extensive electrodiagnostic testing of the left upper extremity and left lower extremity, with additional studies of the right reveals: 1. Median, ulnar, and superficial peroneal sensory responses are normal. 2. Peroneal motor response recording at the extensor digitorum brevis and tibialis anterior is reduced bilaterally. The ulnar motor response is borderline low in amplitude and may be due to body physiognomy. Median and tibial motor responses are normal. 3. There is no active or chronic motor axonal loss changes seen affecting the muscles tested in the upper extremity. 4. In the lower extremities, chronic motor axonal loss changes are seen affecting bilateral L5 myotomes without accompanied active denervation.  Impression: 1. There is electrophysiological evidence of an old L5 radiculopathy affecting bilateral lower extremities. Overall, these findings are mild in degree electrically. 2. There is no definite evidence of a generalized large fiber sensory motor polyneuropathy affecting the left side.  ___________________________ Delena Serve. Patel  Motor Side-To-Side Comparison Table  Conduction velocities marked with (*) have been corrected for temperature  Nerve Stimulus Recording Dist LatOn Norm LatOn B-PAmp Norm B-PAmp CV Norm CV     (mm) (ms)  (mV)  (m/s)      L R L R  L R  L R     Peroneal nerve Ankle EDB 70  4.42  < 6 ms 1.43  > 2.5 mV n/a    Peroneal nerve A. Fibular EDB 350  10.83   1.02   54.5  > 40 m/s  Peroneal nerve B. Fibular EDB 100  13.83   0.94   33.3      Peroneal (TA) Fib.  Head TA   3.50 3.00 < 4.5 ms 2.37 2.34 > 3 mV n/a n/a   Peroneal (TA) Pop. Fos. TA 100 100 5.00 5.00  2.45 2.26  66.7 50.0 > 40 m/s    Tibial Ankle AH 80  3.92  < 6 ms 6.61  > 4 mV n/a    Tibial Pop. Fos. AH 390  12.67   4.15   44.6  > 40 m/s    Median Wrist APB 50  3.08  < 4 ms 9.88  > 6 mV n/a    Median Elbow APB 250  7.17   9.18   61.2  > 50 m/s    Ulnar Wrist ADM 50  2.33  < 3 ms 6.97  > 7 mV n/a  > 50 m/s  Ulnar B. Elbow ADM 220  5.58   6.71   67.7    Ulnar A. Elbow ADM 100  7.08   6.12   66.7     Sensory Side-To-Side Comparison Table  Conduction velocities marked with (*) have been corrected for temperature  Nerve Stimulus Recording Dist LatNPk Norm LatNPk B-PAmp Norm B-PAmp     (mm) (ms)  (V)      L R L R  L R     Superficial Peroneal Lower leg Dors.ft 100  2.73  < 4.6 ms 14.71  > 4 V    Median Wrist Index 130  2.67  < 3.6 ms 30.49  > 15 V    Ulnar Wrist 5th dig. 110  2.43  < 3.1 ms 17.07  > 10 V   Needle EMG Summary  Side Muscle Root Ins. Act. Fibs. PSW Fasc. Number Recruit Amp. MU Dur. Poly.  Left Deltoid Axillary C5-C6 Normal  Left Biceps Brachi. Musculocut C5-C6 Normal  Left Triceps Radial C6-C7 Normal  Left Pronator Ter. Median C6-C7 Normal  Left Dors.Int.1 Ulnar C8-T1 Normal  Left Flexor Digitorum profundus Ant Interos C8-T1 Normal  Left Flexor Pollicis Longus Ant Interos C8-T1 Normal  Left Rectus Fem. Femoral L2-L4 Normal  Left Tibialis Ant. Deep Peron L4-L5 Nml 0 0 0 -1 Mod-R Few +1 Nml  Left Gluteus Med. Sup Gluteal L5-S1 Nml 0 0 0 -1 Mod-R Some +1 Nml  Left Flx.Dig.Ln. Tibial L5-S1 Nml 0 0 0 -2 Mod-R Few +1 Few  Left Gastroc.Med.H. Tibial S1-S2 Normal  Left Extensor indicis proprius  Normal  Right Tibialis Ant. Deep Peron L4-L5 Nml 0 0 0 -1 Mod-R Few +1 Nml  Right Flx.Dig.Ln. Tibial L5-S1 Nml 0 0 0 -1 Mod-V Few +1 Nml  Right Gastroc.Med.H. Tibial S1-S2 Normal  Waveform Images  Left Peroneal nerve Motor Nerve Test     Left Peroneal (TA) Motor Nerve  Test       Left Tibial Motor Nerve Test     Left Median Motor Nerve Test       Left Ulnar Motor Nerve Test     Right Peroneal (TA) Motor Nerve Test       Left Superficial Peroneal Sensory Nerve Test     Left Median Sensory Nerve Test       Left Ulnar Sensory Nerve Test

## 2013-08-23 NOTE — Progress Notes (Signed)
See procedure note for EMG results.  Areeb Corron K. Wilkins Elpers, DO  

## 2013-08-27 ENCOUNTER — Emergency Department (HOSPITAL_COMMUNITY)
Admission: EM | Admit: 2013-08-27 | Discharge: 2013-08-27 | Disposition: A | Discharge status: Home / Self Care | Payer: BC Managed Care – PPO | Source: Home / Self Care | Attending: Family Medicine | Admitting: Family Medicine

## 2013-08-27 ENCOUNTER — Encounter (HOSPITAL_COMMUNITY): Payer: Self-pay | Admitting: Emergency Medicine

## 2013-08-27 DIAGNOSIS — R0981 Nasal congestion: Secondary | ICD-10-CM

## 2013-08-27 DIAGNOSIS — J410 Simple chronic bronchitis: Secondary | ICD-10-CM

## 2013-08-27 DIAGNOSIS — J3489 Other specified disorders of nose and nasal sinuses: Secondary | ICD-10-CM

## 2013-08-27 DIAGNOSIS — J40 Bronchitis, not specified as acute or chronic: Secondary | ICD-10-CM

## 2013-08-27 DIAGNOSIS — Z720 Tobacco use: Secondary | ICD-10-CM

## 2013-08-27 MED ORDER — MINOCYCLINE HCL 100 MG PO CAPS: 100.0000 mg | ORAL_CAPSULE | Freq: Two times a day (BID) | ORAL | Status: DC

## 2013-08-27 MED ORDER — IPRATROPIUM BROMIDE 0.06 % NA SOLN: 2.0000 | Freq: Four times a day (QID) | NASAL | Status: DC

## 2013-08-27 NOTE — Discharge Instructions (Signed)
Take all of medicine, drink lots of fluids, no more smoking,use mucinex daily,see your doctor if further problems and for help with smoking cessation.

## 2013-08-27 NOTE — ED Provider Notes (Signed)
CSN: [id number]     Arrival date & time 08/27/13  1029 History   First MD Initiated Contact with Patient 08/27/13 1050     Chief Complaint  Patient presents with  . Sore Throat   (Consider location/radiation/quality/duration/timing/severity/associated sxs/prior Treatment) Patient is a 53 y.o. female presenting with URI. The history is provided by the patient.  URI Presenting symptoms: congestion, cough, rhinorrhea and sore throat   Presenting symptoms: no fever   Severity:  Moderate Onset quality:  Gradual Duration:  3 days Progression:  Unchanged Chronicity:  Recurrent Associated symptoms: no sinus pain and no wheezing   Risk factors comment:  Smoker   Past Medical History  Diagnosis Date  . DEPRESSION   . ANXIETY   . HYPERLIPIDEMIA   . ASTHMA   . SYMPTOM, PALPITATIONS   . Headache(784.0)     Migrane  . PAIN, CHRONIC NEC   . Hypertension   . BACK PAIN     Recurrent  . Mitral valve prolapse   . Personal history of colonic adenomas 09/02/2012   Past Surgical History  Procedure Laterality Date  . Cesarean section      x2  . Abdominal hysterectomy      1997  . Appendectomy    . Oophorectomy  1996/2000  . Tonsillectomy    . Foot surgery      Left foot  . Knee surgery  2000    Right Knee-Cartilage Problem  . Cervical fusion  2003   Family History  Problem Relation Age of Onset  . Heart disease Mother     CAD/CABG, died of heart attack  . Cancer Other     Colon Cancer  . Arthritis Father   . Diabetes Father   . Diabetes Sister   . COPD Sister   . Cancer Brother     thyroid cancer  . Cancer Maternal Grandmother     ovarian cancer  . Diabetes Sister   . Heart disease Sister   . Colon cancer Maternal Uncle 53  . Colon cancer Maternal Uncle 51   History  Substance Use Topics  . Smoking status: Current Every Day Smoker -- 0.50 packs/day for 25 years    Types: Cigarettes  . Smokeless tobacco: Never Used     Comment: trying to quit  . Alcohol Use: No    OB History   Grav Para Term Preterm Abortions TAB SAB Ect Mult Living                 Review of Systems  Constitutional: Negative.  Negative for fever.  HENT: Positive for congestion, postnasal drip, rhinorrhea and sore throat.   Respiratory: Positive for cough. Negative for shortness of breath and wheezing.   Cardiovascular: Negative.   Gastrointestinal: Negative.     Allergies  Aspirin and Sulfonamide derivatives  Home Medications   Prior to Admission medications   Medication Sig Start Date End Date Taking? Authorizing Provider  amitriptyline (ELAVIL) 10 MG tablet Take 1 tablet by mouth 3 (three) times daily. 03/01/13  Yes Historical Provider, MD  cyclobenzaprine (FLEXERIL) 5 MG tablet Take 1 tablet (5 mg total) by mouth 3 (three) times daily as needed for muscle spasms. 03/04/13  Yes Biagio Borg, MD  gabapentin (NEURONTIN) 300 MG capsule Take 1 capsule by mouth 3 (three) times daily. 02/19/13  Yes Historical Provider, MD  metoprolol (LOPRESSOR) 50 MG tablet Take 1 tablet (50 mg total) by mouth 2 (two) times daily. 05/14/12  Yes Biagio Borg, MD  omeprazole (PRILOSEC) 40 MG capsule Take 40 mg by mouth daily.  06/20/12  Yes Historical Provider, MD  albuterol (VENTOLIN HFA) 108 (90 BASE) MCG/ACT inhaler Inhale 2 puffs into the lungs every 6 (six) hours as needed for wheezing. 06/26/12   Biagio Borg, MD  butalbital-acetaminophen-caffeine (FIORICET, ESGIC) [phone] MG per tablet Take 1 tablet by mouth every 6 (six) hours as needed. For migraines 02/14/12   Biagio Borg, MD  DULoxetine (CYMBALTA) 60 MG capsule Take 1 capsule (60 mg total) by mouth daily. 06/29/13   Biagio Borg, MD  HYDROcodone-acetaminophen (NORCO/VICODIN) 5-325 MG per tablet Take 1-2 tablets every 6 hours as needed for severe pain 03/07/13   Carlisle Cater, PA-C  ipratropium (ATROVENT) 0.06 % nasal spray Place 2 sprays into both nostrils 4 (four) times daily. 08/27/13   Billy Fischer, MD  minocycline (MINOCIN,DYNACIN) 100  MG capsule Take 1 capsule (100 mg total) by mouth 2 (two) times daily. 08/27/13   Billy Fischer, MD  ondansetron (ZOFRAN ODT) 4 MG disintegrating tablet Take 1 tablet (4 mg total) by mouth every 8 (eight) hours as needed for nausea or vomiting. 03/07/13   Carlisle Cater, PA-C  oxyCODONE (OXY IR/ROXICODONE) 5 MG immediate release tablet Take 1 tablet (5 mg total) by mouth every 6 (six) hours as needed. 04/20/13   Biagio Borg, MD   BP 163/95  Pulse 114  Temp(Src) 97.8 F (36.6 C) (Oral)  Resp 22  SpO2 97% Physical Exam  Nursing note and vitals reviewed. Constitutional: She is oriented to person, place, and time. She appears well-developed and well-nourished.  HENT:  Head: Normocephalic.  Right Ear: External ear normal.  Left Ear: External ear normal.  Nose: Mucosal edema and rhinorrhea present.  Mouth/Throat: Oropharynx is clear and moist.  Neck: Normal range of motion. Neck supple. No thyromegaly present.  Cardiovascular: Normal heart sounds.   Pulmonary/Chest: She has no wheezes. She has rhonchi. She has no rales.  Lymphadenopathy:    She has no cervical adenopathy.  Neurological: She is alert and oriented to person, place, and time.  Skin: Skin is warm and dry.    ED Course  Procedures (including critical care time) Labs Review Labs Reviewed - No data to display  Imaging Review No results found.   MDM   1. Sinus congestion   2. Bronchitis due to tobacco use        Billy Fischer, MD 08/27/13 1114

## 2013-08-27 NOTE — ED Notes (Signed)
Pt c/o ST and cough onset 3 days Sx also include: nauseas and PND Denies f/v/n/d Smokes 0.5 PPD  Alert w/no signs of acute distress.

## 2013-08-30 ENCOUNTER — Encounter: Payer: Self-pay | Admitting: Neurology

## 2013-08-30 ENCOUNTER — Ambulatory Visit (INDEPENDENT_AMBULATORY_CARE_PROVIDER_SITE_OTHER): Payer: BC Managed Care – PPO | Admitting: Neurology

## 2013-08-30 VITALS — BP 148/88 | HR 78 | Resp 18 | Ht 71.0 in | Wt 277.7 lb

## 2013-08-30 DIAGNOSIS — IMO0002 Reserved for concepts with insufficient information to code with codable children: Secondary | ICD-10-CM

## 2013-08-30 DIAGNOSIS — M542 Cervicalgia: Secondary | ICD-10-CM

## 2013-08-30 DIAGNOSIS — R258 Other abnormal involuntary movements: Secondary | ICD-10-CM

## 2013-08-30 DIAGNOSIS — M5416 Radiculopathy, lumbar region: Secondary | ICD-10-CM

## 2013-08-30 DIAGNOSIS — R259 Unspecified abnormal involuntary movements: Secondary | ICD-10-CM

## 2013-08-30 DIAGNOSIS — E538 Deficiency of other specified B group vitamins: Secondary | ICD-10-CM
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
## 2013-08-30 NOTE — Patient Instructions (Signed)
1. Start Vitamin b12 1000 mcg daily.  2. We have scheduled you at [place] for your MRI on 09/08/13 at 6:15pm. Please arrive 30 minutes prior and go to [place]. If you need to change this appt please call [phone].

## 2013-09-01 ENCOUNTER — Other Ambulatory Visit: Payer: Self-pay | Admitting: Internal Medicine

## 2013-09-01 MED ORDER — CYCLOBENZAPRINE HCL 5 MG PO TABS: 5.0000 mg | ORAL_TABLET | Freq: Three times a day (TID) | ORAL | Status: DC | PRN

## 2013-09-01 MED ORDER — METOPROLOL TARTRATE 50 MG PO TABS: 50.0000 mg | ORAL_TABLET | Freq: Two times a day (BID) | ORAL | Status: DC

## 2013-09-08 ENCOUNTER — Ambulatory Visit
Admission: RE | Admit: 2013-09-08 | Discharge: 2013-09-08 | Disposition: A | Discharge status: Home / Self Care | Payer: BC Managed Care – PPO | Source: Ambulatory Visit | Attending: Neurology | Admitting: Neurology

## 2013-09-08 DIAGNOSIS — R258 Other abnormal involuntary movements: Secondary | ICD-10-CM

## 2013-09-08 DIAGNOSIS — M542 Cervicalgia: Secondary | ICD-10-CM

## 2013-09-08 DIAGNOSIS — E538 Deficiency of other specified B group vitamins: Secondary | ICD-10-CM

## 2013-09-09 ENCOUNTER — Telehealth: Payer: Self-pay | Admitting: Neurology

## 2013-09-09 NOTE — Telephone Encounter (Signed)
Message copied by Annamaria Helling on Thu Sep 09, 2013  2:59 PM ------      Message from: Narda Amber K      Created: Thu Sep 09, 2013  2:21 PM       Please inform patient that MRI cervical spine shows evidence of old neck surgery and very mild age-related arthritis.  No new findings.       ------

## 2013-09-09 NOTE — Telephone Encounter (Signed)
Patient aware of results.

## 2013-09-23 ENCOUNTER — Telehealth: Payer: Self-pay | Admitting: *Deleted

## 2013-09-23 NOTE — Telephone Encounter (Signed)
Left msg on triage stating ? Problem with Bp feel like she is going to pass out at time. Called pt back she stated would like to see Dr. Jenny Reichmann today inform her their is no appt available today, and if she feel like she is going to pass out she need to go to Er to be evaluated. She stated she has felt like this at least 4 times this week. She agreed that she will go ahead to the Er...Johny Chess

## 2013-11-23 ENCOUNTER — Other Ambulatory Visit: Payer: Self-pay | Admitting: Internal Medicine

## 2013-11-23 NOTE — Telephone Encounter (Signed)
Done erx 

## 2014-01-13 ENCOUNTER — Other Ambulatory Visit: Payer: Self-pay

## 2014-01-13 MED ORDER — CYCLOBENZAPRINE HCL 5 MG PO TABS: 5.0000 mg | ORAL_TABLET | Freq: Three times a day (TID) | ORAL | Status: DC | PRN

## 2014-02-05 ENCOUNTER — Ambulatory Visit (INDEPENDENT_AMBULATORY_CARE_PROVIDER_SITE_OTHER): Payer: BC Managed Care – PPO | Admitting: Family Medicine

## 2014-02-05 ENCOUNTER — Encounter: Payer: Self-pay | Admitting: Family Medicine

## 2014-02-05 VITALS — BP 158/100 | Temp 98.3°F | Wt 270.0 lb

## 2014-02-05 DIAGNOSIS — J0100 Acute maxillary sinusitis, unspecified: Secondary | ICD-10-CM

## 2014-02-05 MED ORDER — AMOXICILLIN-POT CLAVULANATE 875-125 MG PO TABS: 1.0000 | ORAL_TABLET | Freq: Two times a day (BID) | ORAL | Status: DC

## 2014-02-05 NOTE — Progress Notes (Signed)
She has had more back pain recently and is not on opiates. I'll defer to PCP.    duration of symptoms: 2 weeks Rhinorrhea: yes Congestion: yes ear pain: yes sore throat: no Cough: minimal, from throat clearing/post nasal gtt No fevers.   Facial pain noted.   ROS: See HPI.  Otherwise negative.    Meds, vitals, and allergies reviewed.   GEN: nad, alert and oriented HEENT: mucous membranes moist, TM w/o erythema, nasal epithelium injected with scabbing and crusting noted B, OP with cobblestoning, max sinuses ttp B NECK: supple w/o LA CV: rrr. PULM: ctab, no inc wob

## 2014-02-05 NOTE — Patient Instructions (Signed)
Use nasal saline and start the antibiotics today.  Take care.  Glad to see you.

## 2014-02-05 NOTE — Assessment & Plan Note (Signed)
Likely dx, nontoxic, nasal saline and augmentin.  F/u prn.  Routed to PCP as FYI.

## 2014-06-16 ENCOUNTER — Other Ambulatory Visit: Payer: Self-pay | Admitting: Internal Medicine

## 2014-06-27 ENCOUNTER — Other Ambulatory Visit: Payer: Self-pay | Admitting: Internal Medicine

## 2014-06-28 NOTE — Telephone Encounter (Signed)
Done erx 

## 2014-07-14 ENCOUNTER — Other Ambulatory Visit: Payer: Self-pay | Admitting: Internal Medicine

## 2014-07-19 ENCOUNTER — Ambulatory Visit (INDEPENDENT_AMBULATORY_CARE_PROVIDER_SITE_OTHER): Payer: BLUE CROSS/BLUE SHIELD | Admitting: Internal Medicine

## 2014-07-19 ENCOUNTER — Encounter: Payer: Self-pay | Admitting: Internal Medicine

## 2014-07-19 VITALS — BP 126/88 | HR 104 | Temp 98.4°F | Resp 18 | Ht 71.0 in | Wt 265.0 lb

## 2014-07-19 DIAGNOSIS — J309 Allergic rhinitis, unspecified: Secondary | ICD-10-CM

## 2014-07-19 DIAGNOSIS — R739 Hyperglycemia, unspecified: Secondary | ICD-10-CM | POA: Diagnosis not present

## 2014-07-19 DIAGNOSIS — Z0000 Encounter for general adult medical examination without abnormal findings: Secondary | ICD-10-CM

## 2014-07-19 DIAGNOSIS — F329 Major depressive disorder, single episode, unspecified: Secondary | ICD-10-CM

## 2014-07-19 DIAGNOSIS — F32A Depression, unspecified: Secondary | ICD-10-CM

## 2014-07-19 DIAGNOSIS — G4459 Other complicated headache syndrome: Secondary | ICD-10-CM | POA: Diagnosis not present

## 2014-07-19 MED ORDER — SUMATRIPTAN SUCCINATE 6 MG/0.5ML ~~LOC~~ SOAJ: SUBCUTANEOUS | Status: DC

## 2014-07-19 MED ORDER — CYCLOBENZAPRINE HCL 5 MG PO TABS: 5.0000 mg | ORAL_TABLET | Freq: Three times a day (TID) | ORAL | Status: DC | PRN

## 2014-07-19 MED ORDER — DULOXETINE HCL 60 MG PO CPEP: 60.0000 mg | ORAL_CAPSULE | Freq: Every day | ORAL | Status: DC

## 2014-07-19 NOTE — Assessment & Plan Note (Signed)

## 2014-07-19 NOTE — Assessment & Plan Note (Signed)
Mild worsening, no SI or HI, declines counseling, for cymbalta restart

## 2014-07-19 NOTE — Assessment & Plan Note (Signed)
Asympt, for a1c, ? New onset DM, consider oha for a1c > 7

## 2014-07-19 NOTE — Progress Notes (Signed)
Subjective:    Patient ID: [name], female    DOB: 08-Mar-1961, 54 y.o.   MRN: [id number]  HPI  Here for wellness and f/u;  Overall doing ok;  Pt denies Chest pain, worsening SOB, DOE, wheezing, orthopnea, PND, worsening LE edema, palpitations, dizziness or syncope.  Pt denies neurological change such as new headache, facial or extremity weakness.  Pt denies polydipsia, polyuria, or low sugar symptoms. Pt states overall good compliance with treatment and medications, good tolerability, and has been trying to follow appropriate diet.  Pt has had mild worsening depressive symptoms x 2 wks since ran out of med, but no suicidal ideation or panic. No fever, night sweats, wt loss, loss of appetite, or other constitutional symptoms.  Pt states good ability with ADL's, has low fall risk, home safety reviewed and adequate, no other significant changes in hearing or vision, and only occasionally active with exercise.  Has check cbg at home with a relative's glucometer with sugars 160 and 187 recently.  Does have several wks ongoing nasal allergy symptoms with clearish congestion, itch and sneezing, and dizziness without fever, pain, ST, cough, swelling or wheezing.  Asks for trial imitrex injection as a friend did will with this for migraine Past Medical History  Diagnosis Date  . DEPRESSION   . ANXIETY   . HYPERLIPIDEMIA   . ASTHMA   . SYMPTOM, PALPITATIONS   . Headache(784.0)     Migrane  . PAIN, CHRONIC NEC   . Hypertension   . BACK PAIN     Recurrent  . Mitral valve prolapse   . Personal history of colonic adenomas 09/02/2012   Past Surgical History  Procedure Laterality Date  . Cesarean section      x2  . Abdominal hysterectomy      1997  . Appendectomy    . Oophorectomy  1996/2000  . Tonsillectomy    . Foot surgery      Left foot  . Knee surgery  2000    Right Knee-Cartilage Problem  . Cervical fusion  2003    reports that she has been smoking Cigarettes.  She has a 12.5  pack-year smoking history. She has never used smokeless tobacco. She reports that she does not drink alcohol or use illicit drugs. family history includes Arthritis in her father; COPD in her sister; Cancer in her brother, maternal grandmother, and other; Colon cancer (age of onset: 47) in her maternal uncle; Colon cancer (age of onset: 71) in her maternal uncle; Diabetes in her father, sister, and sister; Heart disease in her mother and sister. Allergies  Allergen Reactions  . Aspirin Hives and Shortness Of Breath  . Sulfonamide Derivatives Swelling   Current Outpatient Prescriptions on File Prior to Visit  Medication Sig Dispense Refill  . albuterol (VENTOLIN HFA) 108 (90 BASE) MCG/ACT inhaler Inhale 2 puffs into the lungs every 6 (six) hours as needed for wheezing. 8.5 Inhaler 6  . amitriptyline (ELAVIL) 10 MG tablet Take 1 tablet by mouth 3 (three) times daily.    Marland Kitchen amitriptyline (ELAVIL) 50 MG tablet TAKE 1-2 TABLETS BY MOUTH AT BEDTIME 60 tablet 5  . amoxicillin-clavulanate (AUGMENTIN) 875-125 MG per tablet Take 1 tablet by mouth 2 (two) times daily. 20 tablet 0  . butalbital-acetaminophen-caffeine (FIORICET, ESGIC) 50-325-40 MG per tablet Take 1 tablet by mouth every 6 (six) hours as needed. For migraines 20 tablet 1  . gabapentin (NEURONTIN) 300 MG capsule Take 1 capsule by mouth 3 (three) times daily.    Marland Kitchen  ipratropium (ATROVENT) 0.06 % nasal spray Place 2 sprays into both nostrils 4 (four) times daily. 15 mL 1  . metoprolol (LOPRESSOR) 50 MG tablet Take 1 tablet (50 mg total) by mouth 2 (two) times daily. 180 tablet 3  . minocycline (MINOCIN,DYNACIN) 100 MG capsule Take 1 capsule (100 mg total) by mouth 2 (two) times daily. 20 capsule 0  . omeprazole (PRILOSEC) 40 MG capsule Take 40 mg by mouth daily.      No current facility-administered medications on file prior to visit.   Review of Systems Constitutional: Negative for increased diaphoresis, other activity, appetite or siginficant  weight change other than noted HENT: Negative for worsening hearing loss, ear pain, facial swelling, mouth sores and neck stiffness.   Eyes: Negative for other worsening pain, redness or visual disturbance.  Respiratory: Negative for shortness of breath and wheezing  Cardiovascular: Negative for chest pain and palpitations.  Gastrointestinal: Negative for diarrhea, blood in stool, abdominal distention or other pain Genitourinary: Negative for hematuria, flank pain or change in urine volume.  Musculoskeletal: Negative for myalgias or other joint complaints.  Skin: Negative for color change and wound or drainage.  Neurological: Negative for syncope and numbness. other than noted Hematological: Negative for adenopathy. or other swelling Psychiatric/Behavioral: Negative for hallucinations, SI, self-injury, decreased concentration or other worsening agitation.      Objective:   Physical Exam BP 126/88 mmHg  Pulse 104  Temp(Src) 98.4 F (36.9 C) (Oral)  Resp 18  Ht 5\' 11"  (1.803 m)  Wt 265 lb 0.6 oz (120.221 kg)  BMI 36.98 kg/m2  SpO2 97% VS noted,  Constitutional: Pt is oriented to person, place, and time. Appears well-developed and well-nourished, in no significant distress Head: Normocephalic and atraumatic.  Right Ear: External ear normal.  Left Ear: External ear normal.  Nose: Nose normal.  Mouth/Throat: Oropharynx is clear and moist.  Bilat tm's with mild erythema.  Max sinus areas non tender.  Pharynx with mild erythema, no exudate Eyes: Conjunctivae and EOM are normal. Pupils are equal, round, and reactive to light.  Neck: Normal range of motion. Neck supple. No JVD present. No tracheal deviation present or significant neck LA or mass Cardiovascular: Normal rate, regular rhythm, normal heart sounds and intact distal pulses.   Pulmonary/Chest: Effort normal and breath sounds without rales or wheezing  Abdominal: Soft. Bowel sounds are normal. NT. No HSM  Musculoskeletal: Normal  range of motion. Exhibits no edema.  Lymphadenopathy:  Has no cervical adenopathy.  Neurological: Pt is alert and oriented to person, place, and time. Pt has normal reflexes. No cranial nerve deficit. Motor grossly intact Skin: Skin is warm and dry. No rash noted.  Psychiatric:  Has nervous/depressed mood and affect. Behavior is normal.     Assessment & Plan:

## 2014-07-19 NOTE — Assessment & Plan Note (Signed)
For otc zyrtec/nasacort asd,  to f/u any worsening symptoms or concerns

## 2014-07-19 NOTE — Assessment & Plan Note (Signed)
Ok for imitrex inj prn trial,  to f/u any worsening symptoms or concerns

## 2014-07-19 NOTE — Patient Instructions (Signed)
Please take all new medication as prescribed - the imitrex injection  Please continue all other medications as before, and refills have been done if requested.  Please have the pharmacy call with any other refills you may need.  Please continue your efforts at being more active, low cholesterol diet, and weight control.  You are otherwise up to date with prevention measures today.  Please keep your appointments with your specialists as you may have planned  Please go to the LAB in the Basement (turn left off the elevator) for the tests to be done tomorrow  You will be contacted by phone if any changes need to be made immediately.  Otherwise, you will receive a letter about your results with an explanation, but please check with MyChart first.  Please remember to sign up for MyChart if you have not done so, as this will be important to you in the future with finding out test results, communicating by private email, and scheduling acute appointments online when needed.  Please return in 6 months, or sooner if needed

## 2014-07-20 ENCOUNTER — Other Ambulatory Visit (INDEPENDENT_AMBULATORY_CARE_PROVIDER_SITE_OTHER): Payer: BLUE CROSS/BLUE SHIELD

## 2014-07-20 DIAGNOSIS — R7989 Other specified abnormal findings of blood chemistry: Secondary | ICD-10-CM

## 2014-07-20 DIAGNOSIS — Z0000 Encounter for general adult medical examination without abnormal findings: Secondary | ICD-10-CM

## 2014-07-20 DIAGNOSIS — R739 Hyperglycemia, unspecified: Secondary | ICD-10-CM | POA: Diagnosis not present

## 2014-07-20 LAB — BASIC METABOLIC PANEL
BUN: 10 mg/dL (ref 6–23)
CO2: 28 mEq/L (ref 19–32)
Calcium: 9.8 mg/dL (ref 8.4–10.5)
Chloride: 104 mEq/L (ref 96–112)
Creatinine, Ser: 1.39 mg/dL — ABNORMAL HIGH (ref 0.40–1.20)
GFR: 42.2 mL/min — ABNORMAL LOW (ref >60.00)
GLUCOSE: 107 mg/dL — AB (ref 70–99)
POTASSIUM: 4 meq/L (ref 3.5–5.1)
Sodium: 138 mEq/L (ref 135–145)

## 2014-07-20 LAB — URINALYSIS, ROUTINE W REFLEX MICROSCOPIC
Bilirubin Urine: NEGATIVE
Hgb urine dipstick: NEGATIVE
KETONES UR: NEGATIVE
Leukocytes, UA: NEGATIVE
Nitrite: NEGATIVE
PH: 6 (ref 5.0–8.0)
RBC / HPF: NONE SEEN (ref 0–?)
Total Protein, Urine: NEGATIVE
UROBILINOGEN UA: 0.2 (ref 0.0–1.0)
Urine Glucose: NEGATIVE

## 2014-07-20 LAB — HEPATIC FUNCTION PANEL
ALT: 76 U/L — ABNORMAL HIGH (ref 0–35)
AST: 51 U/L — ABNORMAL HIGH (ref 0–37)
Albumin: 4.4 g/dL (ref 3.5–5.2)
Alkaline Phosphatase: 85 U/L (ref 39–117)
Bilirubin, Direct: 0.1 mg/dL (ref 0.0–0.3)
TOTAL PROTEIN: 7.1 g/dL (ref 6.0–8.3)
Total Bilirubin: 0.4 mg/dL (ref 0.2–1.2)

## 2014-07-20 LAB — LDL CHOLESTEROL, DIRECT: Direct LDL: 142 mg/dL

## 2014-07-20 LAB — LIPID PANEL
Cholesterol: 218 mg/dL — ABNORMAL HIGH (ref 0–200)
HDL: 40.5 mg/dL (ref >39.00)
NonHDL: 177.5
Total CHOL/HDL Ratio: 5
Triglycerides: 323 mg/dL — ABNORMAL HIGH (ref 0.0–149.0)
VLDL: 64.6 mg/dL — AB (ref 0.0–40.0)

## 2014-07-20 LAB — CBC WITH DIFFERENTIAL/PLATELET
Basophils Absolute: 0.1 10*3/uL (ref 0.0–0.1)
Basophils Relative: 0.7 % (ref 0.0–3.0)
EOS PCT: 2 % (ref 0.0–5.0)
Eosinophils Absolute: 0.2 10*3/uL (ref 0.0–0.7)
HEMATOCRIT: 44.2 % (ref 36.0–46.0)
Hemoglobin: 15.2 g/dL — ABNORMAL HIGH (ref 12.0–15.0)
LYMPHS ABS: 3.5 10*3/uL (ref 0.7–4.0)
Lymphocytes Relative: 41 % (ref 12.0–46.0)
MCHC: 34.4 g/dL (ref 30.0–36.0)
MCV: 94 fl (ref 78.0–100.0)
MONO ABS: 0.7 10*3/uL (ref 0.1–1.0)
Monocytes Relative: 7.8 % (ref 3.0–12.0)
NEUTROS ABS: 4.2 10*3/uL (ref 1.4–7.7)
NEUTROS PCT: 48.5 % (ref 43.0–77.0)
Platelets: 216 10*3/uL (ref 150.0–400.0)
RBC: 4.7 Mil/uL (ref 3.87–5.11)
RDW: 13.1 % (ref 11.5–15.5)
WBC: 8.6 10*3/uL (ref 4.0–10.5)

## 2014-07-20 LAB — HEMOGLOBIN A1C: Hgb A1c MFr Bld: 5.7 % (ref 4.6–6.5)

## 2014-07-20 LAB — TSH: TSH: 8.57 u[IU]/mL — AB (ref 0.35–4.50)

## 2014-08-04 ENCOUNTER — Encounter: Payer: Self-pay | Admitting: Internal Medicine

## 2014-08-04 ENCOUNTER — Ambulatory Visit (INDEPENDENT_AMBULATORY_CARE_PROVIDER_SITE_OTHER): Payer: BLUE CROSS/BLUE SHIELD | Admitting: Internal Medicine

## 2014-08-04 VITALS — BP 122/76 | HR 89 | Temp 98.5°F | Wt 268.0 lb

## 2014-08-04 DIAGNOSIS — F329 Major depressive disorder, single episode, unspecified: Secondary | ICD-10-CM

## 2014-08-04 DIAGNOSIS — J019 Acute sinusitis, unspecified: Secondary | ICD-10-CM

## 2014-08-04 DIAGNOSIS — I10 Essential (primary) hypertension: Secondary | ICD-10-CM | POA: Diagnosis not present

## 2014-08-04 DIAGNOSIS — F32A Depression, unspecified: Secondary | ICD-10-CM

## 2014-08-04 MED ORDER — HYDROCODONE-HOMATROPINE 5-1.5 MG/5ML PO SYRP: 5.0000 mL | ORAL_SOLUTION | Freq: Four times a day (QID) | ORAL | Status: DC | PRN

## 2014-08-04 MED ORDER — LEVOFLOXACIN 250 MG PO TABS: 250.0000 mg | ORAL_TABLET | Freq: Every day | ORAL | Status: DC

## 2014-08-04 NOTE — Patient Instructions (Signed)
Please take all new medication as prescribed  Please continue all other medications as before, and refills have been done if requested.  Please have the pharmacy call with any other refills you may need.  Please keep your appointments with your specialists as you may have planned  Please stop smoking

## 2014-08-04 NOTE — Assessment & Plan Note (Signed)
Mild to mod, for antibx course,  to f/u any worsening symptoms or concerns 

## 2014-08-04 NOTE — Progress Notes (Signed)
Pre visit review using our clinic review tool, if applicable. No additional management support is needed unless otherwise documented below in the visit note. 

## 2014-08-04 NOTE — Progress Notes (Signed)
Subjective:    Patient ID: [name], female    DOB: 01-09-1961, 54 y.o.   MRN: [id number]  HPI  Here with 2-3 days acute onset fever, facial pain, pressure, headache, general weakness and malaise, and greenish d/c, with mild ST and cough, but pt denies chest pain, wheezing, increased sob or doe, orthopnea, PND, increased LE swelling, palpitations, dizziness or syncope.  Pt denies new neurological symptoms such as new headache, or facial or extremity weakness or numbness   Pt denies polydipsia, polyuria,  Denies worsening depressive symptoms, suicidal ideation, or panic Past Medical History  Diagnosis Date  . DEPRESSION   . ANXIETY   . HYPERLIPIDEMIA   . ASTHMA   . SYMPTOM, PALPITATIONS   . Headache(784.0)     Migrane  . PAIN, CHRONIC NEC   . Hypertension   . BACK PAIN     Recurrent  . Mitral valve prolapse   . Personal history of colonic adenomas 09/02/2012   Past Surgical History  Procedure Laterality Date  . Cesarean section      x2  . Abdominal hysterectomy      1997  . Appendectomy    . Oophorectomy  1996/2000  . Tonsillectomy    . Foot surgery      Left foot  . Knee surgery  2000    Right Knee-Cartilage Problem  . Cervical fusion  2003    reports that she has been smoking Cigarettes.  She has a 12.5 pack-year smoking history. She has never used smokeless tobacco. She reports that she does not drink alcohol or use illicit drugs. family history includes Arthritis in her father; COPD in her sister; Cancer in her brother, maternal grandmother, and other; Colon cancer (age of onset: 68) in her maternal uncle; Colon cancer (age of onset: 61) in her maternal uncle; Diabetes in her father, sister, and sister; Heart disease in her mother and sister. Allergies  Allergen Reactions  . Aspirin Hives and Shortness Of Breath  . Sulfonamide Derivatives Swelling   Current Outpatient Prescriptions on File Prior to Visit  Medication Sig Dispense Refill  . albuterol (VENTOLIN  HFA) 108 (90 BASE) MCG/ACT inhaler Inhale 2 puffs into the lungs every 6 (six) hours as needed for wheezing. 8.5 Inhaler 6  . amitriptyline (ELAVIL) 10 MG tablet Take 1 tablet by mouth 3 (three) times daily.    Marland Kitchen amitriptyline (ELAVIL) 50 MG tablet TAKE 1-2 TABLETS BY MOUTH AT BEDTIME 60 tablet 5  . amoxicillin-clavulanate (AUGMENTIN) 875-125 MG per tablet Take 1 tablet by mouth 2 (two) times daily. 20 tablet 0  . butalbital-acetaminophen-caffeine (FIORICET, ESGIC) 50-325-40 MG per tablet Take 1 tablet by mouth every 6 (six) hours as needed. For migraines 20 tablet 1  . cyclobenzaprine (FLEXERIL) 5 MG tablet Take 1 tablet (5 mg total) by mouth 3 (three) times daily as needed for muscle spasms. 60 tablet 2  . DULoxetine (CYMBALTA) 60 MG capsule Take 1 capsule (60 mg total) by mouth daily. 90 capsule 3  . gabapentin (NEURONTIN) 300 MG capsule Take 1 capsule by mouth 3 (three) times daily.    Marland Kitchen ipratropium (ATROVENT) 0.06 % nasal spray Place 2 sprays into both nostrils 4 (four) times daily. 15 mL 1  . metoprolol (LOPRESSOR) 50 MG tablet Take 1 tablet (50 mg total) by mouth 2 (two) times daily. 180 tablet 3  . minocycline (MINOCIN,DYNACIN) 100 MG capsule Take 1 capsule (100 mg total) by mouth 2 (two) times daily. 20 capsule 0  .  omeprazole (PRILOSEC) 40 MG capsule Take 40 mg by mouth daily.     . SUMAtriptan 6 MG/0.5ML SOAJ Use as directed 1 injection per day as needed 10 Syringe 11   No current facility-administered medications on file prior to visit.    Review of Systems  Constitutional: Negative for unusual diaphoresis or night sweats HENT: Negative for ringing in ear or discharge Eyes: Negative for double vision or worsening visual disturbance.  Respiratory: Negative for choking and stridor.   Gastrointestinal: Negative for vomiting or other signifcant bowel change Genitourinary: Negative for hematuria or change in urine volume.  Musculoskeletal: Negative for other MSK pain or swelling Skin:  Negative for color change and worsening wound.  Neurological: Negative for tremors and numbness other than noted  Psychiatric/Behavioral: Negative for decreased concentration or agitation other than above       Objective:   Physical Exam BP 122/76 mmHg  Pulse 89  Temp(Src) 98.5 F (36.9 C) (Oral)  Wt 268 lb (121.564 kg)  SpO2 97% VS noted, mild ill Constitutional: Pt appears in no significant distress HENT: Head: NCAT.  Right Ear: External ear normal.  Left Ear: External ear normal.  Eyes: . Pupils are equal, round, and reactive to light. Conjunctivae and EOM are normal Bilat tm's with mild erythema.  Max sinus areas mild tender.  Pharynx with severe erythema, + exudate Neck: Normal range of motion. Neck supple.  Cardiovascular: Normal rate and regular rhythm.   Pulmonary/Chest: Effort normal and breath sounds without rales or wheezing.  Neurological: Pt is alert. Not confused , motor grossly intact Skin: Skin is warm. No rash, no LE edema Psychiatric: Pt behavior is normal. No agitation.      Assessment & Plan:

## 2014-08-04 NOTE — Assessment & Plan Note (Signed)
stable overall by history and exam, recent data reviewed with pt, and pt to continue medical treatment as before,  to f/u any worsening symptoms or concerns Lab Results  Component Value Date   WBC 8.6 07/20/2014   HGB 15.2* 07/20/2014   HCT 44.2 07/20/2014   PLT 216.0 07/20/2014   GLUCOSE 107* 07/20/2014   CHOL 218* 07/20/2014   TRIG 323.0* 07/20/2014   HDL 40.50 07/20/2014   LDLDIRECT 142.0 07/20/2014   LDLCALC 118* 08/12/2008   ALT 76* 07/20/2014   AST 51* 07/20/2014   NA 138 07/20/2014   K 4.0 07/20/2014   CL 104 07/20/2014   CREATININE 1.39* 07/20/2014   BUN 10 07/20/2014   CO2 28 07/20/2014   TSH 8.57* 07/20/2014   HGBA1C 5.7 07/20/2014

## 2014-08-04 NOTE — Assessment & Plan Note (Signed)
stable overall by history and exam, recent data reviewed with pt, and pt to continue medical treatment as before,  to f/u any worsening symptoms or concerns BP Readings from Last 3 Encounters:  08/04/14 122/76  07/19/14 126/88  02/05/14 158/100

## 2015-01-11 ENCOUNTER — Telehealth: Payer: Self-pay | Admitting: Internal Medicine

## 2015-01-11 MED ORDER — METOPROLOL TARTRATE 50 MG PO TABS: 50.0000 mg | ORAL_TABLET | Freq: Two times a day (BID) | ORAL | Status: DC

## 2015-01-11 MED ORDER — CYCLOBENZAPRINE HCL 5 MG PO TABS: 5.0000 mg | ORAL_TABLET | Freq: Three times a day (TID) | ORAL | Status: DC | PRN

## 2015-01-11 NOTE — Telephone Encounter (Signed)
Is requesting scripts for metoprolol and cyclobenzaprine to be sent to [facility] on [address] in [place].

## 2015-05-08 ENCOUNTER — Telehealth: Payer: Self-pay | Admitting: Internal Medicine

## 2015-05-08 NOTE — Telephone Encounter (Signed)
Patient Name: [name]  DOB: 11-11-1960    Initial Comment Caller states she found a lump on her right breast, and it's getting bigger since yesterday- red. Painful to touch.   Nurse Assessment  Nurse: Raphael Gibney, RN, Vanita Ingles Date/Time (Eastern Time): 05/08/2015 10:42:58 AM  Confirm and document reason for call. If symptomatic, describe symptoms. You must click the next button to save text entered. ---Caller states she found lump on her right breast yesterday. Lump is bigger today than yesterday. Breast is red and painful. No fever.  Has the patient traveled out of the country within the last 30 days? ---Not Applicable  Does the patient have any new or worsening symptoms? ---Yes  Will a triage be completed? ---Yes  Related visit to physician within the last 2 weeks? ---No  Does the PT have any chronic conditions? (i.e. diabetes, asthma, etc.) ---Yes  List chronic conditions. ---prediabetes; mitral valve prolapse  Is the patient pregnant or possibly pregnant? (Ask all females between the ages of 62-55) ---No  Is this a behavioral health or substance abuse call? ---No     Guidelines    Guideline Title Affirmed Question Affirmed Notes  Breast Symptoms [1] Looks infected (warmth, redness, painful to touch) AND [2] no fever    Final Disposition User   See Physician within 24 Hours Stringer, RN, Vera    Comments  No appts available at [facility] within the 24 hr outcome. Pt does not want to go to urgent care. Pt would like to be called back for appt tomorrow or any cancellation for today.   Referrals  GO TO FACILITY REFUSED   Disagree/Comply: Disagree  Disagree/Comply Reason: Disagree with instructions

## 2015-05-09 ENCOUNTER — Ambulatory Visit (INDEPENDENT_AMBULATORY_CARE_PROVIDER_SITE_OTHER): Payer: BLUE CROSS/BLUE SHIELD | Admitting: Internal Medicine

## 2015-05-09 ENCOUNTER — Encounter: Payer: Self-pay | Admitting: Internal Medicine

## 2015-05-09 VITALS — BP 138/82 | HR 82 | Temp 98.7°F | Wt 274.0 lb

## 2015-05-09 DIAGNOSIS — L02419 Cutaneous abscess of limb, unspecified: Secondary | ICD-10-CM | POA: Diagnosis not present

## 2015-05-09 MED ORDER — DOXYCYCLINE HYCLATE 100 MG PO TABS: 100.0000 mg | ORAL_TABLET | Freq: Two times a day (BID) | ORAL | Status: DC

## 2015-05-09 MED ORDER — MUPIROCIN 2 % EX OINT: TOPICAL_OINTMENT | CUTANEOUS | Status: DC

## 2015-05-09 NOTE — Assessment & Plan Note (Signed)
Doxy x10 d Bactroban oint See procedure

## 2015-05-09 NOTE — Progress Notes (Signed)
Subjective:  Patient ID: [place], female    DOB: June 14, 1960  Age: 55 y.o. MRN: [id number]  CC: No chief complaint on file.   HPI Alexanderia Obas Bolding presents for a painful lump on R breast enlarging since Sun am. No fever  Outpatient Prescriptions Prior to Visit  Medication Sig Dispense Refill  . albuterol (VENTOLIN HFA) 108 (90 BASE) MCG/ACT inhaler Inhale 2 puffs into the lungs every 6 (six) hours as needed for wheezing. 8.5 Inhaler 6  . butalbital-acetaminophen-caffeine (FIORICET, ESGIC) 50-325-40 MG per tablet Take 1 tablet by mouth every 6 (six) hours as needed. For migraines 20 tablet 1  . cyclobenzaprine (FLEXERIL) 5 MG tablet Take 1 tablet (5 mg total) by mouth 3 (three) times daily as needed for muscle spasms. 60 tablet 2  . DULoxetine (CYMBALTA) 60 MG capsule Take 1 capsule (60 mg total) by mouth daily. 90 capsule 3  . gabapentin (NEURONTIN) 300 MG capsule Take 1 capsule by mouth 3 (three) times daily.    Marland Kitchen ipratropium (ATROVENT) 0.06 % nasal spray Place 2 sprays into both nostrils 4 (four) times daily. 15 mL 1  . metoprolol (LOPRESSOR) 50 MG tablet Take 1 tablet (50 mg total) by mouth 2 (two) times daily. 180 tablet 1  . minocycline (MINOCIN,DYNACIN) 100 MG capsule Take 1 capsule (100 mg total) by mouth 2 (two) times daily. 20 capsule 0  . omeprazole (PRILOSEC) 40 MG capsule Take 40 mg by mouth daily.     . SUMAtriptan 6 MG/0.5ML SOAJ Use as directed 1 injection per day as needed 10 Syringe 11  . amitriptyline (ELAVIL) 10 MG tablet Take 1 tablet by mouth 3 (three) times daily.    Marland Kitchen amitriptyline (ELAVIL) 50 MG tablet TAKE 1-2 TABLETS BY MOUTH AT BEDTIME 60 tablet 5  . HYDROcodone-homatropine (HYCODAN) 5-1.5 MG/5ML syrup Take 5 mLs by mouth every 6 (six) hours as needed for cough. 180 mL 0  . levofloxacin (LEVAQUIN) 250 MG tablet Take 1 tablet (250 mg total) by mouth daily. 10 tablet 0   No facility-administered medications prior to visit.    ROS Review of Systems    Constitutional: Negative for chills, activity change, appetite change, fatigue and unexpected weight change.  HENT: Negative for congestion, mouth sores and sinus pressure.   Eyes: Negative for visual disturbance.  Respiratory: Negative for cough and chest tightness.   Gastrointestinal: Negative for nausea and abdominal pain.  Genitourinary: Negative for frequency, difficulty urinating and vaginal pain.  Musculoskeletal: Negative for back pain and gait problem.  Skin: Positive for color change and rash. Negative for pallor.  Neurological: Negative for dizziness, tremors, weakness, numbness and headaches.  Psychiatric/Behavioral: Negative for confusion and sleep disturbance.    Objective:  BP 138/82 mmHg  Pulse 82  Temp(Src) 98.7 F (37.1 C) (Oral)  Wt 274 lb (124.286 kg)  SpO2 97%  BP Readings from Last 3 Encounters:  05/09/15 138/82  08/04/14 122/76  07/19/14 126/88    Wt Readings from Last 3 Encounters:  05/09/15 274 lb (124.286 kg)  08/04/14 268 lb (121.564 kg)  07/19/14 265 lb 0.6 oz (120.221 kg)    Physical Exam  Constitutional: She appears well-developed. No distress.  Abdominal: There is no guarding.  Musculoskeletal: She exhibits no edema.  Neurological: Coordination abnormal.  Skin: Rash noted. There is erythema. No pallor.   R axilla cutaneous nodule 2.2x1.5 cm with 3 white heads Cane Obese  Procedure note:  Incision and Drainage of an Abscess   Indication :  a localized collection of pus that is tender and not spontaneously resolving.    Risks including unsuccessful procedure , possible need for a repeat procedure due to pus accumulation, scar formation, and others as well as benefits were explained to the patient in detail. Written consent was obtained/signed.    The patient was placed in a decubitus position. The area of an abscess was prepped with povidone-iodine and draped in a sterile fashion. Local anesthesia with   2    cc of 2% lidocaine and  epinephrine  was administered.  1.5 cm incision with #11strait blade was made. About 2-3 cc of purulent material was expressed. The abscess cavity was explored with a sterile hemostat and the walled- off pockets and septae were broken down bluntly. The cavity was irrigated with the rest of the anesthetic in the syringe and packed with 3 inches of  the iodoform gauze.   The wound was dressed with antibiotic ointment and Telfa pad.  Tolerated well. Complications: None.   Wound instructions provided.    Lab Results  Component Value Date   WBC 8.6 07/20/2014   HGB 15.2* 07/20/2014   HCT 44.2 07/20/2014   PLT 216.0 07/20/2014   GLUCOSE 107* 07/20/2014   CHOL 218* 07/20/2014   TRIG 323.0* 07/20/2014   HDL 40.50 07/20/2014   LDLDIRECT 142.0 07/20/2014   LDLCALC 118* 08/12/2008   ALT 76* 07/20/2014   AST 51* 07/20/2014   NA 138 07/20/2014   K 4.0 07/20/2014   CL 104 07/20/2014   CREATININE 1.39* 07/20/2014   BUN 10 07/20/2014   CO2 28 07/20/2014   TSH 8.57* 07/20/2014   HGBA1C 5.7 07/20/2014    Mr Cervical Spine Wo Contrast  09/08/2013  CLINICAL DATA:  Neck pain, clonus EXAM: MRI CERVICAL SPINE WITHOUT CONTRAST TECHNIQUE: Multiplanar, multisequence MR imaging of the cervical spine was performed. No intravenous contrast was administered. COMPARISON:  None. FINDINGS: Image quality degraded by mild motion. ACDF at C4-5 with anterior plate and screws. Normal alignment no fracture. No cord compression or cord lesion. Cord evaluation is limited given the amount of motion and artifact. C2-3:  Negative C3-4: Disc degeneration and spondylosis with diffuse uncinate spurring bilaterally. Mild spinal stenosis. Mild foraminal stenosis bilaterally C4-5:  ACDF without stenosis. C5-6:  Mild degenerative change.  Mild spondylosis. C6-7:  Mild disc degeneration and early spurring C7-T1:  Negative IMPRESSION: ACDF at C4-5 without stenosis C3-4 spondylosis with mild spinal stenosis and mild foraminal stenosis  bilaterally. Electronically Signed   By: Franchot Gallo M.D.   On: 09/08/2013 20:56    Assessment & Plan:   There are no diagnoses linked to this encounter. I have discontinued Ms. Spong's levofloxacin and HYDROcodone-homatropine. I am also having her maintain her butalbital-acetaminophen-caffeine, albuterol, omeprazole, gabapentin, minocycline, ipratropium, DULoxetine, SUMAtriptan, metoprolol, cyclobenzaprine, EMBEDA, HYDROcodone-acetaminophen, and amitriptyline.  Meds ordered this encounter  Medications  . EMBEDA 20-0.8 MG CPCR    Sig: Take 1 capsule by mouth at bedtime.  Marland Kitchen HYDROcodone-acetaminophen (NORCO) 7.5-325 MG tablet    Sig: as needed.  Marland Kitchen amitriptyline (ELAVIL) 100 MG tablet    Sig: Take 1 tablet by mouth at bedtime.     Follow-up: No Follow-up on file.  Walker Kehr, MD

## 2015-05-09 NOTE — Progress Notes (Signed)
Pre visit review using our clinic review tool, if applicable. No additional management support is needed unless otherwise documented below in the visit note. 

## 2015-07-11 DIAGNOSIS — G894 Chronic pain syndrome: Secondary | ICD-10-CM | POA: Diagnosis not present

## 2015-08-22 DIAGNOSIS — M549 Dorsalgia, unspecified: Secondary | ICD-10-CM | POA: Diagnosis not present

## 2015-08-29 ENCOUNTER — Other Ambulatory Visit: Payer: Self-pay | Admitting: Internal Medicine

## 2015-09-04 ENCOUNTER — Other Ambulatory Visit: Payer: Self-pay | Admitting: Orthopaedic Surgery

## 2015-09-04 DIAGNOSIS — G894 Chronic pain syndrome: Secondary | ICD-10-CM

## 2015-09-05 ENCOUNTER — Ambulatory Visit
Admission: RE | Admit: 2015-09-05 | Discharge: 2015-09-05 | Disposition: A | Discharge status: Home / Self Care | Payer: BLUE CROSS/BLUE SHIELD | Source: Ambulatory Visit | Attending: Orthopaedic Surgery | Admitting: Orthopaedic Surgery

## 2015-09-05 DIAGNOSIS — M5126 Other intervertebral disc displacement, lumbar region: Secondary | ICD-10-CM | POA: Diagnosis not present

## 2015-09-05 DIAGNOSIS — G894 Chronic pain syndrome: Secondary | ICD-10-CM

## 2015-09-21 DIAGNOSIS — G894 Chronic pain syndrome: Secondary | ICD-10-CM | POA: Diagnosis not present

## 2015-09-21 DIAGNOSIS — M5126 Other intervertebral disc displacement, lumbar region: Secondary | ICD-10-CM | POA: Diagnosis not present

## 2015-09-27 DIAGNOSIS — M5126 Other intervertebral disc displacement, lumbar region: Secondary | ICD-10-CM | POA: Diagnosis not present

## 2015-10-17 ENCOUNTER — Encounter: Payer: Self-pay | Admitting: Internal Medicine

## 2015-10-18 DIAGNOSIS — M5126 Other intervertebral disc displacement, lumbar region: Secondary | ICD-10-CM | POA: Diagnosis not present

## 2015-11-02 DIAGNOSIS — Z79899 Other long term (current) drug therapy: Secondary | ICD-10-CM | POA: Diagnosis not present

## 2015-11-02 DIAGNOSIS — M4806 Spinal stenosis, lumbar region: Secondary | ICD-10-CM | POA: Diagnosis not present

## 2015-11-02 DIAGNOSIS — M4726 Other spondylosis with radiculopathy, lumbar region: Secondary | ICD-10-CM | POA: Diagnosis not present

## 2015-11-02 DIAGNOSIS — Z6841 Body Mass Index (BMI) 40.0 and over, adult: Secondary | ICD-10-CM | POA: Diagnosis not present

## 2015-11-02 DIAGNOSIS — M5126 Other intervertebral disc displacement, lumbar region: Secondary | ICD-10-CM | POA: Diagnosis not present

## 2015-11-09 ENCOUNTER — Encounter: Payer: Self-pay | Admitting: Internal Medicine

## 2015-11-17 DIAGNOSIS — M4726 Other spondylosis with radiculopathy, lumbar region: Secondary | ICD-10-CM | POA: Diagnosis not present

## 2015-11-17 DIAGNOSIS — M5126 Other intervertebral disc displacement, lumbar region: Secondary | ICD-10-CM | POA: Diagnosis not present

## 2015-11-17 DIAGNOSIS — Z4689 Encounter for fitting and adjustment of other specified devices: Secondary | ICD-10-CM | POA: Diagnosis not present

## 2015-11-17 DIAGNOSIS — M4806 Spinal stenosis, lumbar region: Secondary | ICD-10-CM | POA: Diagnosis not present

## 2015-11-20 DIAGNOSIS — Z01818 Encounter for other preprocedural examination: Secondary | ICD-10-CM | POA: Diagnosis not present

## 2015-11-21 DIAGNOSIS — M47816 Spondylosis without myelopathy or radiculopathy, lumbar region: Secondary | ICD-10-CM | POA: Diagnosis not present

## 2015-11-21 DIAGNOSIS — J45909 Unspecified asthma, uncomplicated: Secondary | ICD-10-CM | POA: Diagnosis not present

## 2015-11-21 DIAGNOSIS — I341 Nonrheumatic mitral (valve) prolapse: Secondary | ICD-10-CM | POA: Diagnosis not present

## 2015-11-21 DIAGNOSIS — F17210 Nicotine dependence, cigarettes, uncomplicated: Secondary | ICD-10-CM | POA: Diagnosis not present

## 2015-11-21 DIAGNOSIS — M4806 Spinal stenosis, lumbar region: Secondary | ICD-10-CM | POA: Diagnosis not present

## 2015-11-21 DIAGNOSIS — Z472 Encounter for removal of internal fixation device: Secondary | ICD-10-CM | POA: Diagnosis not present

## 2015-11-21 DIAGNOSIS — I252 Old myocardial infarction: Secondary | ICD-10-CM | POA: Diagnosis not present

## 2015-11-21 DIAGNOSIS — M4726 Other spondylosis with radiculopathy, lumbar region: Secondary | ICD-10-CM | POA: Diagnosis not present

## 2015-11-21 DIAGNOSIS — Z79899 Other long term (current) drug therapy: Secondary | ICD-10-CM | POA: Diagnosis not present

## 2015-11-21 DIAGNOSIS — I10 Essential (primary) hypertension: Secondary | ICD-10-CM | POA: Diagnosis not present

## 2015-11-22 DIAGNOSIS — M4806 Spinal stenosis, lumbar region: Secondary | ICD-10-CM | POA: Diagnosis not present

## 2015-11-22 DIAGNOSIS — Z79899 Other long term (current) drug therapy: Secondary | ICD-10-CM | POA: Diagnosis not present

## 2015-11-22 DIAGNOSIS — M47816 Spondylosis without myelopathy or radiculopathy, lumbar region: Secondary | ICD-10-CM | POA: Diagnosis not present

## 2015-11-22 DIAGNOSIS — F17210 Nicotine dependence, cigarettes, uncomplicated: Secondary | ICD-10-CM | POA: Diagnosis not present

## 2015-11-22 DIAGNOSIS — I10 Essential (primary) hypertension: Secondary | ICD-10-CM | POA: Diagnosis not present

## 2015-11-22 DIAGNOSIS — I341 Nonrheumatic mitral (valve) prolapse: Secondary | ICD-10-CM | POA: Diagnosis not present

## 2015-11-22 DIAGNOSIS — I252 Old myocardial infarction: Secondary | ICD-10-CM | POA: Diagnosis not present

## 2015-11-22 DIAGNOSIS — M961 Postlaminectomy syndrome, not elsewhere classified: Secondary | ICD-10-CM | POA: Diagnosis not present

## 2015-11-22 DIAGNOSIS — J45909 Unspecified asthma, uncomplicated: Secondary | ICD-10-CM | POA: Diagnosis not present

## 2015-11-23 DIAGNOSIS — Z7951 Long term (current) use of inhaled steroids: Secondary | ICD-10-CM | POA: Diagnosis not present

## 2015-11-23 DIAGNOSIS — M4806 Spinal stenosis, lumbar region: Secondary | ICD-10-CM | POA: Diagnosis not present

## 2015-11-23 DIAGNOSIS — Z4789 Encounter for other orthopedic aftercare: Secondary | ICD-10-CM | POA: Diagnosis not present

## 2015-11-24 DIAGNOSIS — Z4789 Encounter for other orthopedic aftercare: Secondary | ICD-10-CM | POA: Diagnosis not present

## 2015-11-24 DIAGNOSIS — M4806 Spinal stenosis, lumbar region: Secondary | ICD-10-CM | POA: Diagnosis not present

## 2015-11-24 DIAGNOSIS — Z7951 Long term (current) use of inhaled steroids: Secondary | ICD-10-CM | POA: Diagnosis not present

## 2015-11-27 DIAGNOSIS — Z4789 Encounter for other orthopedic aftercare: Secondary | ICD-10-CM | POA: Diagnosis not present

## 2015-11-27 DIAGNOSIS — Z7951 Long term (current) use of inhaled steroids: Secondary | ICD-10-CM | POA: Diagnosis not present

## 2015-11-27 DIAGNOSIS — M4806 Spinal stenosis, lumbar region: Secondary | ICD-10-CM | POA: Diagnosis not present

## 2015-11-29 ENCOUNTER — Other Ambulatory Visit: Payer: Self-pay | Admitting: Internal Medicine

## 2015-11-30 DIAGNOSIS — Z7951 Long term (current) use of inhaled steroids: Secondary | ICD-10-CM | POA: Diagnosis not present

## 2015-11-30 DIAGNOSIS — M4806 Spinal stenosis, lumbar region: Secondary | ICD-10-CM | POA: Diagnosis not present

## 2015-11-30 DIAGNOSIS — Z4789 Encounter for other orthopedic aftercare: Secondary | ICD-10-CM | POA: Diagnosis not present

## 2015-12-06 DIAGNOSIS — M4806 Spinal stenosis, lumbar region: Secondary | ICD-10-CM | POA: Diagnosis not present

## 2015-12-06 DIAGNOSIS — Z4789 Encounter for other orthopedic aftercare: Secondary | ICD-10-CM | POA: Diagnosis not present

## 2015-12-06 DIAGNOSIS — Z7951 Long term (current) use of inhaled steroids: Secondary | ICD-10-CM | POA: Diagnosis not present

## 2015-12-22 DIAGNOSIS — M4726 Other spondylosis with radiculopathy, lumbar region: Secondary | ICD-10-CM | POA: Diagnosis not present

## 2015-12-24 ENCOUNTER — Other Ambulatory Visit: Payer: Self-pay | Admitting: Internal Medicine

## 2015-12-27 DIAGNOSIS — Z7951 Long term (current) use of inhaled steroids: Secondary | ICD-10-CM | POA: Diagnosis not present

## 2015-12-27 DIAGNOSIS — M48061 Spinal stenosis, lumbar region without neurogenic claudication: Secondary | ICD-10-CM | POA: Diagnosis not present

## 2015-12-27 DIAGNOSIS — Z4789 Encounter for other orthopedic aftercare: Secondary | ICD-10-CM | POA: Diagnosis not present

## 2016-01-30 ENCOUNTER — Ambulatory Visit: Payer: BLUE CROSS/BLUE SHIELD | Admitting: Internal Medicine

## 2016-02-22 DIAGNOSIS — M5126 Other intervertebral disc displacement, lumbar region: Secondary | ICD-10-CM | POA: Diagnosis not present

## 2016-02-22 DIAGNOSIS — G894 Chronic pain syndrome: Secondary | ICD-10-CM | POA: Diagnosis not present

## 2016-04-03 DIAGNOSIS — Z6839 Body mass index (BMI) 39.0-39.9, adult: Secondary | ICD-10-CM | POA: Diagnosis not present

## 2016-04-03 DIAGNOSIS — M5126 Other intervertebral disc displacement, lumbar region: Secondary | ICD-10-CM | POA: Diagnosis not present

## 2016-04-03 DIAGNOSIS — G894 Chronic pain syndrome: Secondary | ICD-10-CM | POA: Diagnosis not present

## 2016-04-03 DIAGNOSIS — M545 Low back pain: Secondary | ICD-10-CM | POA: Diagnosis not present

## 2016-05-08 DIAGNOSIS — M791 Myalgia: Secondary | ICD-10-CM | POA: Diagnosis not present

## 2016-05-08 DIAGNOSIS — G894 Chronic pain syndrome: Secondary | ICD-10-CM | POA: Diagnosis not present

## 2016-05-08 DIAGNOSIS — Z79899 Other long term (current) drug therapy: Secondary | ICD-10-CM | POA: Diagnosis not present

## 2016-05-08 DIAGNOSIS — Z6839 Body mass index (BMI) 39.0-39.9, adult: Secondary | ICD-10-CM | POA: Diagnosis not present

## 2016-05-08 DIAGNOSIS — M5126 Other intervertebral disc displacement, lumbar region: Secondary | ICD-10-CM | POA: Diagnosis not present

## 2016-05-08 DIAGNOSIS — M545 Low back pain: Secondary | ICD-10-CM | POA: Diagnosis not present

## 2016-06-03 ENCOUNTER — Other Ambulatory Visit: Payer: Self-pay | Admitting: Internal Medicine

## 2016-07-05 DIAGNOSIS — M545 Low back pain: Secondary | ICD-10-CM | POA: Diagnosis not present

## 2016-07-05 DIAGNOSIS — G894 Chronic pain syndrome: Secondary | ICD-10-CM | POA: Diagnosis not present

## 2016-07-05 DIAGNOSIS — M542 Cervicalgia: Secondary | ICD-10-CM | POA: Diagnosis not present

## 2016-07-05 DIAGNOSIS — M47892 Other spondylosis, cervical region: Secondary | ICD-10-CM | POA: Diagnosis not present

## 2016-07-09 ENCOUNTER — Ambulatory Visit (INDEPENDENT_AMBULATORY_CARE_PROVIDER_SITE_OTHER): Payer: BLUE CROSS/BLUE SHIELD | Admitting: Internal Medicine

## 2016-07-09 ENCOUNTER — Encounter: Payer: Self-pay | Admitting: Internal Medicine

## 2016-07-09 VITALS — BP 126/90 | HR 112 | Ht 70.0 in | Wt 273.0 lb

## 2016-07-09 DIAGNOSIS — F411 Generalized anxiety disorder: Secondary | ICD-10-CM

## 2016-07-09 DIAGNOSIS — B029 Zoster without complications: Secondary | ICD-10-CM

## 2016-07-09 DIAGNOSIS — I10 Essential (primary) hypertension: Secondary | ICD-10-CM | POA: Diagnosis not present

## 2016-07-09 MED ORDER — DOXYCYCLINE HYCLATE 100 MG PO TABS: 100.0000 mg | ORAL_TABLET | Freq: Two times a day (BID) | ORAL | 0 refills | Status: DC

## 2016-07-09 MED ORDER — VALACYCLOVIR HCL 1 G PO TABS: 1000.0000 mg | ORAL_TABLET | Freq: Three times a day (TID) | ORAL | 0 refills | Status: DC

## 2016-07-09 NOTE — Assessment & Plan Note (Signed)
stable overall by history and exam, recent data reviewed with pt, and pt to continue medical treatment as before,  to f/u any worsening symptoms or concerns BP Readings from Last 3 Encounters:  07/09/16 126/90  05/09/15 138/82  08/04/14 122/76

## 2016-07-09 NOTE — Progress Notes (Signed)
Subjective:    Patient ID:  [name], female    DOB: [birth year], 56 y.o.   MRN: [id number]  HPI   Here to f/u with 4-5 days worsening painful vesicle type rash to right lower face involving the cheek/chin/right tongue and lips.  Pain worse in last few days, and now starting to crust and remains weepy. Also associated with right submandibular and submental swelling that has enlarged over 3 days. Tongue painful as well with multiple vesicles as well, now some whitish in nature, but Only to the right half tongue.  No ulceration.  Seemed to start soon after a cervical ESI last wk per her surgeon to neck.  No fever, other rash or swelling Pt denies chest pain, increased sob or doe, wheezing, orthopnea, PND, increased LE swelling, palpitations, dizziness or syncope.  Pt denies other new neurological symptoms such as new headache, or facial or extremity weakness or numbness  Pt denies polydipsia, polyuria. Denies worsening depressive symptoms, suicidal ideation, or panic Past Medical History:  Diagnosis Date  . ANXIETY   . ASTHMA   . BACK PAIN    Recurrent  . DEPRESSION   . Headache(784.0)    Migrane  . HYPERLIPIDEMIA   . Hypertension   . Mitral valve prolapse   . PAIN, CHRONIC NEC   . Personal history of colonic adenomas 09/02/2012  . SYMPTOM, PALPITATIONS    Past Surgical History:  Procedure Laterality Date  . ABDOMINAL HYSTERECTOMY     1997  . APPENDECTOMY    . CERVICAL FUSION  2003  . CESAREAN SECTION     x2  . FOOT SURGERY     Left foot  . KNEE SURGERY  2000   Right Knee-Cartilage Problem  . OOPHORECTOMY  1996/2000  . TONSILLECTOMY      reports that she has been smoking Cigarettes.  She has a 12.50 pack-year smoking history. She has never used smokeless tobacco. She reports that she does not drink alcohol or use drugs. family history includes Arthritis in her father; COPD in her sister; Cancer in her brother, maternal grandmother, and other; Colon cancer (age of onset:  69) in her maternal uncle; Colon cancer (age of onset: 66) in her maternal uncle; Diabetes in her father, sister, and sister; Heart disease in her mother and sister. Allergies  Allergen Reactions  . Aspirin Hives and Shortness Of Breath  . Sulfonamide Derivatives Swelling   Current Outpatient Prescriptions on File Prior to Visit  Medication Sig Dispense Refill  . albuterol (VENTOLIN HFA) 108 (90 BASE) MCG/ACT inhaler Inhale 2 puffs into the lungs every 6 (six) hours as needed for wheezing. 8.5 Inhaler 6  . amitriptyline (ELAVIL) 100 MG tablet Take 1 tablet by mouth at bedtime.    . butalbital-acetaminophen-caffeine (FIORICET, ESGIC) 50-325-40 MG per tablet Take 1 tablet by mouth every 6 (six) hours as needed. For migraines 20 tablet 1  . cyclobenzaprine (FLEXERIL) 5 MG tablet Take 1 tablet (5 mg total) by mouth 3 (three) times daily as needed for muscle spasms. 60 tablet 2  . DULoxetine (CYMBALTA) 60 MG capsule TAKE 1 CAPSULE (60 MG TOTAL) BY MOUTH DAILY. 90 capsule 3  . EMBEDA 20-0.8 MG CPCR Take 1 capsule by mouth at bedtime.    . gabapentin (NEURONTIN) 300 MG capsule Take 1 capsule by mouth 3 (three) times daily.    Marland Kitchen HYDROcodone-acetaminophen (NORCO) 7.5-325 MG tablet as needed.    Marland Kitchen ipratropium (ATROVENT) 0.06 % nasal spray Place 2 sprays into both nostrils  4 (four) times daily. 15 mL 1  . metoprolol (LOPRESSOR) 50 MG tablet TAKE 1 TABLET (50 MG TOTAL) BY MOUTH 2 (TWO) TIMES DAILY. 180 tablet 1  . minocycline (MINOCIN,DYNACIN) 100 MG capsule Take 1 capsule (100 mg total) by mouth 2 (two) times daily. 20 capsule 0  . mupirocin ointment (BACTROBAN) 2 % Use qd-bid w/dressing change 30 g 0  . omeprazole (PRILOSEC) 40 MG capsule Take 40 mg by mouth daily.     . SUMAtriptan 6 MG/0.5ML SOAJ Use as directed 1 injection per day as needed 10 Syringe 11   No current facility-administered medications on file prior to visit.    Review of Systems  Constitutional: Negative for other unusual  diaphoresis or sweats HENT: Negative for ear discharge or swelling Eyes: Negative for other worsening visual disturbances Respiratory: Negative for stridor or other swelling  Gastrointestinal: Negative for worsening distension or other blood Genitourinary: Negative for retention or other urinary change Musculoskeletal: Negative for other MSK pain or swelling Skin: Negative for color change or other new lesions Neurological: Negative for worsening tremors and other numbness  Psychiatric/Behavioral: Negative for worsening agitation or other fatigue All other system neg per pt    Objective:   Physical Exam BP 126/90   Pulse (!) 112   Ht 5\' 10"  (1.778 m)   Wt 273 lb (123.8 kg)   SpO2 98%   BMI 39.17 kg/m  VS noted, mild ill appearing Constitutional: Pt appears in NAD HENT: Head: NCAT.  Right Ear: External ear normal.  Left Ear: External ear normal.  Eyes: . Pupils are equal, round, and reactive to light. Conjunctivae and EOM are normal Nose: without d/c or deformity Neck: Neck supple. Gross normal ROM Cardiovascular: Normal rate and regular rhythm.   Pulmonary/Chest: Effort normal and breath sounds without rales or wheezing.  Abd:  Soft, NT, ND, + BS, no organomegaly Neurological: Pt is alert. At baseline orientation, motor grossly intact Skin: Skin is warm. no LE edemal right lower third face tongue and lips with numerous grouped vesicles on erythem base, now with some crusting and weeping, assoc with large nonfluctuant but likely reactive Lyphadenopathy noted as well to right submandibular and submental areas Psychiatric: Pt behavior is normal without agitation  No other exam findings    Assessment & Plan:

## 2016-07-09 NOTE — Progress Notes (Signed)
Pre visit review using our clinic review tool, if applicable. No additional management support is needed unless otherwise documented below in the visit note. 

## 2016-07-09 NOTE — Patient Instructions (Addendum)
Please take all new medication as prescribed - the valtrex and the doxycycline  Please continue all other medications as before, including the pain medication and gabapentin per your surgeon  Please have the pharmacy call with any other refills you may need.  Please keep your appointments with your specialists as you may have planned

## 2016-07-09 NOTE — Assessment & Plan Note (Signed)
Mild to mod, with some concern for superinfeciton, for doxy course as well as valtrex, already has vicodin as well as a new rx per surgury to retstart the gabapening,  to f/u any worsening symptoms or concerns

## 2016-07-09 NOTE — Assessment & Plan Note (Signed)
stable overall by history and exam, and pt to continue medical treatment as before,  to f/u any worsening symptoms or concerns 

## 2016-07-24 ENCOUNTER — Ambulatory Visit (INDEPENDENT_AMBULATORY_CARE_PROVIDER_SITE_OTHER): Payer: BLUE CROSS/BLUE SHIELD | Admitting: Internal Medicine

## 2016-07-24 ENCOUNTER — Encounter: Payer: Self-pay | Admitting: Internal Medicine

## 2016-07-24 VITALS — BP 114/76 | HR 88 | Ht 70.5 in | Wt 270.0 lb

## 2016-07-24 DIAGNOSIS — I10 Essential (primary) hypertension: Secondary | ICD-10-CM

## 2016-07-24 DIAGNOSIS — H6091 Unspecified otitis externa, right ear: Secondary | ICD-10-CM | POA: Diagnosis not present

## 2016-07-24 MED ORDER — NEOMYCIN-POLYMYXIN-HC 1 % OT SOLN: 3.0000 [drp] | Freq: Four times a day (QID) | OTIC | 0 refills | Status: DC

## 2016-07-24 MED ORDER — CIPROFLOXACIN HCL 500 MG PO TABS: 500.0000 mg | ORAL_TABLET | Freq: Two times a day (BID) | ORAL | 0 refills | Status: AC

## 2016-07-24 MED ORDER — KETOROLAC TROMETHAMINE 30 MG/ML IJ SOLN
30.0000 mg | Freq: Once | INTRAMUSCULAR | Status: AC
  Administered 2016-07-24: 30 mg via INTRAMUSCULAR

## 2016-07-24 NOTE — Progress Notes (Signed)
Subjective:    Patient ID: [name], female    DOB: [birth year], 56 y.o.   MRN: [id number]  HPI  Here to f/u with acute c/o pain, tenderness and swelling feeling to the right ear, assoc with a sort of right ear hearing loss nearly complete like there might be a cotton in it, with increased pain.  No overt drainage, but has been feeling warm, has HA , fatigue, and several chills.  No ST, head congestion, blood, cough and Pt denies chest pain, increased sob or doe, wheezing, orthopnea, PND, increased LE swelling, palpitations, dizziness or syncope.  Shingles rash much improved with crusting only now.  Past Medical History:  Diagnosis Date  . ANXIETY   . ASTHMA   . BACK PAIN    Recurrent  . DEPRESSION   . Headache(784.0)    Migrane  . HYPERLIPIDEMIA   . Hypertension   . Mitral valve prolapse   . PAIN, CHRONIC NEC   . Personal history of colonic adenomas 09/02/2012  . SYMPTOM, PALPITATIONS    Past Surgical History:  Procedure Laterality Date  . ABDOMINAL HYSTERECTOMY     1997  . APPENDECTOMY    . CERVICAL FUSION  2003  . CESAREAN SECTION     x2  . FOOT SURGERY     Left foot  . KNEE SURGERY  2000   Right Knee-Cartilage Problem  . OOPHORECTOMY  1996/2000  . TONSILLECTOMY      reports that she has been smoking Cigarettes.  She has a 12.50 pack-year smoking history. She has never used smokeless tobacco. She reports that she does not drink alcohol or use drugs. family history includes Arthritis in her father; COPD in her sister; Cancer in her brother, maternal grandmother, and other; Colon cancer (age of onset: 23) in her maternal uncle; Colon cancer (age of onset: 58) in her maternal uncle; Diabetes in her father, sister, and sister; Heart disease in her mother and sister. Allergies  Allergen Reactions  . Aspirin Hives and Shortness Of Breath  . Sulfonamide Derivatives Swelling   Current Outpatient Prescriptions on File Prior to Visit  Medication Sig Dispense Refill  .  albuterol (VENTOLIN HFA) 108 (90 BASE) MCG/ACT inhaler Inhale 2 puffs into the lungs every 6 (six) hours as needed for wheezing. 8.5 Inhaler 6  . amitriptyline (ELAVIL) 100 MG tablet Take 1 tablet by mouth at bedtime.    . butalbital-acetaminophen-caffeine (FIORICET, ESGIC) 50-325-40 MG per tablet Take 1 tablet by mouth every 6 (six) hours as needed. For migraines 20 tablet 1  . cyclobenzaprine (FLEXERIL) 5 MG tablet Take 1 tablet (5 mg total) by mouth 3 (three) times daily as needed for muscle spasms. 60 tablet 2  . doxycycline (VIBRA-TABS) 100 MG tablet Take 1 tablet (100 mg total) by mouth 2 (two) times daily. 20 tablet 0  . DULoxetine (CYMBALTA) 60 MG capsule TAKE 1 CAPSULE (60 MG TOTAL) BY MOUTH DAILY. 90 capsule 3  . EMBEDA 20-0.8 MG CPCR Take 1 capsule by mouth at bedtime.    . gabapentin (NEURONTIN) 300 MG capsule Take 1 capsule by mouth 3 (three) times daily.    Marland Kitchen HYDROcodone-acetaminophen (NORCO) 7.5-325 MG tablet as needed.    Marland Kitchen ipratropium (ATROVENT) 0.06 % nasal spray Place 2 sprays into both nostrils 4 (four) times daily. 15 mL 1  . metoprolol (LOPRESSOR) 50 MG tablet TAKE 1 TABLET (50 MG TOTAL) BY MOUTH 2 (TWO) TIMES DAILY. 180 tablet 1  . minocycline (MINOCIN,DYNACIN) 100 MG capsule  Take 1 capsule (100 mg total) by mouth 2 (two) times daily. 20 capsule 0  . mupirocin ointment (BACTROBAN) 2 % Use qd-bid w/dressing change 30 g 0  . omeprazole (PRILOSEC) 40 MG capsule Take 40 mg by mouth daily.     . SUMAtriptan 6 MG/0.5ML SOAJ Use as directed 1 injection per day as needed 10 Syringe 11  . valACYclovir (VALTREX) 1000 MG tablet Take 1 tablet (1,000 mg total) by mouth 3 (three) times daily. 21 tablet 0   No current facility-administered medications on file prior to visit.    Review of Systems All otherwise neg per pt    Objective:   Physical Exam BP 114/76   Pulse 88   Ht 5' 10.5" (1.791 m)   Wt 270 lb (122.5 kg)   SpO2 96%   BMI 38.19 kg/m  VS noted, mild ill, winces to  right ear palpation Constitutional: Pt appears in NAD HENT: Head: NCAT.  Right Ear: External ear normal. Right ext canal with 1-2+ tender, red, swelling without significant d/c or mucous Left Ear: External ear normal. left canal negative - no red/tender/sweling Bilat tm's with trace erythema.  Max sinus areas non tender.  Pharynx with mild erythema, no exudate Eyes: . Pupils are equal, round, and reactive to light. Conjunctivae and EOM are normal Nose: without d/c or deformity Neck: Neck supple. Gross normal ROM Cardiovascular: Normal rate and regular rhythm.   Pulmonary/Chest: Effort normal and breath sounds without rales or wheezing.  Neurological: Pt is alert. At baseline orientation, motor grossly intact Skin: Skin is warm. No rashes, other new lesions, no LE edema Psychiatric: Pt behavior is normal without agitation  No other exam findings    Assessment & Plan:

## 2016-07-24 NOTE — Patient Instructions (Signed)
You had the pain shot today (toradol)  Please take all new medication as prescribed  - the ear drops and the pill antibiotics  Please continue all other medications as before, and refills have been done if requested.  Please have the pharmacy call with any other refills you may need.  Please keep your appointments with your specialists as you may have planned

## 2016-07-24 NOTE — Progress Notes (Signed)
Pre visit review using our clinic review tool, if applicable. No additional management support is needed unless otherwise documented below in the visit note. 

## 2016-07-26 NOTE — Assessment & Plan Note (Addendum)
Mild to mod, for toradol 30 IM x 1 for pain , also for antibx course topical and oral, tylenol prn pain,  to f/u any worsening symptoms or concerns

## 2016-07-26 NOTE — Assessment & Plan Note (Signed)
stable overall by history and exam, recent data reviewed with pt, and pt to continue medical treatment as before,  to f/u any worsening symptoms or concerns BP Readings from Last 3 Encounters:  07/24/16 114/76  07/09/16 126/90  05/09/15 138/82

## 2016-08-21 DIAGNOSIS — M47896 Other spondylosis, lumbar region: Secondary | ICD-10-CM | POA: Diagnosis not present

## 2016-08-21 DIAGNOSIS — M542 Cervicalgia: Secondary | ICD-10-CM | POA: Diagnosis not present

## 2016-08-21 DIAGNOSIS — G894 Chronic pain syndrome: Secondary | ICD-10-CM | POA: Diagnosis not present

## 2016-08-21 DIAGNOSIS — M791 Myalgia: Secondary | ICD-10-CM | POA: Diagnosis not present

## 2016-09-21 ENCOUNTER — Other Ambulatory Visit: Payer: Self-pay | Admitting: Internal Medicine

## 2016-10-11 DIAGNOSIS — G894 Chronic pain syndrome: Secondary | ICD-10-CM | POA: Diagnosis not present

## 2016-11-05 ENCOUNTER — Other Ambulatory Visit: Payer: Self-pay | Admitting: Internal Medicine

## 2016-11-05 NOTE — Telephone Encounter (Signed)
Done erx 

## 2016-12-02 DIAGNOSIS — M47896 Other spondylosis, lumbar region: Secondary | ICD-10-CM | POA: Diagnosis not present

## 2016-12-02 DIAGNOSIS — M791 Myalgia: Secondary | ICD-10-CM | POA: Diagnosis not present

## 2016-12-02 DIAGNOSIS — G894 Chronic pain syndrome: Secondary | ICD-10-CM | POA: Diagnosis not present

## 2016-12-02 DIAGNOSIS — S7002XA Contusion of left hip, initial encounter: Secondary | ICD-10-CM | POA: Diagnosis not present

## 2016-12-02 DIAGNOSIS — M545 Low back pain: Secondary | ICD-10-CM | POA: Diagnosis not present

## 2017-02-05 DIAGNOSIS — G894 Chronic pain syndrome: Secondary | ICD-10-CM | POA: Diagnosis not present

## 2017-02-05 DIAGNOSIS — M545 Low back pain: Secondary | ICD-10-CM | POA: Diagnosis not present

## 2017-02-05 DIAGNOSIS — I10 Essential (primary) hypertension: Secondary | ICD-10-CM | POA: Diagnosis not present

## 2017-02-05 DIAGNOSIS — Z79899 Other long term (current) drug therapy: Secondary | ICD-10-CM | POA: Diagnosis not present

## 2017-02-05 DIAGNOSIS — Z6839 Body mass index (BMI) 39.0-39.9, adult: Secondary | ICD-10-CM | POA: Diagnosis not present

## 2017-04-04 ENCOUNTER — Other Ambulatory Visit: Payer: Self-pay

## 2017-04-04 MED ORDER — METOPROLOL TARTRATE 50 MG PO TABS: 50.0000 mg | ORAL_TABLET | Freq: Two times a day (BID) | ORAL | 1 refills | Status: DC

## 2017-04-12 ENCOUNTER — Encounter: Payer: Self-pay | Admitting: Nurse Practitioner

## 2017-04-12 ENCOUNTER — Ambulatory Visit: Payer: BLUE CROSS/BLUE SHIELD | Admitting: Nurse Practitioner

## 2017-04-12 VITALS — BP 130/90 | HR 92 | Temp 98.5°F | Resp 14 | Ht 70.0 in | Wt 288.0 lb

## 2017-04-12 DIAGNOSIS — K0510 Chronic gingivitis, plaque induced: Secondary | ICD-10-CM

## 2017-04-12 DIAGNOSIS — K047 Periapical abscess without sinus: Secondary | ICD-10-CM

## 2017-04-12 DIAGNOSIS — H6091 Unspecified otitis externa, right ear: Secondary | ICD-10-CM | POA: Diagnosis not present

## 2017-04-12 MED ORDER — AMOXICILLIN-POT CLAVULANATE 875-125 MG PO TABS: 1.0000 | ORAL_TABLET | Freq: Two times a day (BID) | ORAL | 0 refills | Status: DC

## 2017-04-12 MED ORDER — CHLORHEXIDINE GLUCONATE 0.12 % MT SOLN: 15.0000 mL | Freq: Two times a day (BID) | OROMUCOSAL | 0 refills | Status: DC

## 2017-04-12 NOTE — Patient Instructions (Addendum)
Need to see orthodontist ASAP.  Use tylenol or ibuprofen for pain.

## 2017-04-12 NOTE — Progress Notes (Signed)
Subjective:  Patient ID:  [name], female    DOB: [birth year]  Age: 57 y.o. MRN: [id number]  CC: Facial Swelling (x 4 days, right facial swelling. No new foods or medication. Hx of abscess of gums/tooth)   Dental Pain   This is a recurrent problem. The current episode started more than 1 month ago. The problem has been waxing and waning. Associated symptoms include facial pain, sinus pressure and thermal sensitivity. Pertinent negatives include no difficulty swallowing, fever or oral bleeding. She has tried acetaminophen for the symptoms. The treatment provided no relief.    Outpatient Medications Prior to Visit  Medication Sig Dispense Refill  . albuterol (VENTOLIN HFA) 108 (90 BASE) MCG/ACT inhaler Inhale 2 puffs into the lungs every 6 (six) hours as needed for wheezing. 8.5 Inhaler 6  . amitriptyline (ELAVIL) 100 MG tablet Take 1 tablet by mouth at bedtime.    . butalbital-acetaminophen-caffeine (FIORICET, ESGIC) 50-325-40 MG per tablet Take 1 tablet by mouth every 6 (six) hours as needed. For migraines 20 tablet 1  . cyclobenzaprine (FLEXERIL) 5 MG tablet Take 1 tablet (5 mg total) by mouth 3 (three) times daily as needed for muscle spasms. 60 tablet 2  . DULoxetine (CYMBALTA) 60 MG capsule TAKE 1 CAPSULE (60 MG TOTAL) BY MOUTH DAILY. 90 capsule 2  . gabapentin (NEURONTIN) 300 MG capsule Take 1 capsule by mouth 3 (three) times daily.    Marland Kitchen HYDROcodone-acetaminophen (NORCO) 7.5-325 MG tablet as needed.    . metoprolol tartrate (LOPRESSOR) 50 MG tablet Take 1 tablet (50 mg total) by mouth 2 (two) times daily. 180 tablet 1  . doxycycline (VIBRA-TABS) 100 MG tablet Take 1 tablet (100 mg total) by mouth 2 (two) times daily. 20 tablet 0  . EMBEDA 20-0.8 MG CPCR Take 1 capsule by mouth at bedtime.    Marland Kitchen ipratropium (ATROVENT) 0.06 % nasal spray Place 2 sprays into both nostrils 4 (four) times daily. 15 mL 1  . minocycline (MINOCIN,DYNACIN) 100 MG capsule Take 1 capsule (100 mg total) by  mouth 2 (two) times daily. 20 capsule 0  . mupirocin ointment (BACTROBAN) 2 % Use qd-bid w/dressing change 30 g 0  . NEOMYCIN-POLYMYXIN-HYDROCORTISONE (CORTISPORIN) 1 % SOLN otic solution Place 3 drops into the right ear every 6 (six) hours. 10 mL 0  . omeprazole (PRILOSEC) 40 MG capsule Take 40 mg by mouth daily.     . SUMAtriptan 6 MG/0.5ML SOAJ Use as directed 1 injection per day as needed (Patient not taking: Reported on 04/12/2017) 10 Syringe 11  . valACYclovir (VALTREX) 1000 MG tablet Take 1 tablet (1,000 mg total) by mouth 3 (three) times daily. (Patient not taking: Reported on 04/12/2017) 21 tablet 0   No facility-administered medications prior to visit.     ROS See HPI  Objective:  BP 130/90   Pulse 92   Temp 98.5 F (36.9 C) (Oral)   Resp 14   Ht 5\' 10"  (1.778 m)   Wt 288 lb (130.6 kg)   SpO2 98%   BMI 41.32 kg/m   BP Readings from Last 3 Encounters:  04/12/17 130/90  07/24/16 114/76  07/09/16 126/90    Wt Readings from Last 3 Encounters:  04/12/17 288 lb (130.6 kg)  07/24/16 270 lb (122.5 kg)  07/09/16 273 lb (123.8 kg)    Physical Exam  HENT:  Right Ear: External ear and ear canal normal. No tenderness. No mastoid tenderness. Tympanic membrane is injected and erythematous. A middle ear effusion is present.  No decreased hearing is noted.  Left Ear: External ear and ear canal normal. Tympanic membrane is not injected and not erythematous. A middle ear effusion is present.  Nose: Mucosal edema present. Right sinus exhibits maxillary sinus tenderness. Right sinus exhibits no frontal sinus tenderness. Left sinus exhibits no maxillary sinus tenderness and no frontal sinus tenderness.  Mouth/Throat: She has dentures. No oral lesions. There is trismus in the jaw. Abnormal dentition. Dental caries present. No uvula swelling or lacerations. No oropharyngeal exudate or posterior oropharyngeal erythema.    Neck: Normal range of motion. Neck supple.  Cardiovascular: Normal  rate.  Pulmonary/Chest: Effort normal. No stridor.  Lymphadenopathy:    She has cervical adenopathy.  Vitals reviewed.   Lab Results  Component Value Date   WBC 8.6 07/20/2014   HGB 15.2 (H) 07/20/2014   HCT 44.2 07/20/2014   PLT 216.0 07/20/2014   GLUCOSE 107 (H) 07/20/2014   CHOL 218 (H) 07/20/2014   TRIG 323.0 (H) 07/20/2014   HDL 40.50 07/20/2014   LDLDIRECT 142.0 07/20/2014   LDLCALC 118 (H) 08/12/2008   ALT 76 (H) 07/20/2014   AST 51 (H) 07/20/2014   NA 138 07/20/2014   K 4.0 07/20/2014   CL 104 07/20/2014   CREATININE 1.39 (H) 07/20/2014   BUN 10 07/20/2014   CO2 28 07/20/2014   TSH 8.57 (H) 07/20/2014   HGBA1C 5.7 07/20/2014    Mr Lumbar Spine Wo Contrast  Result Date: 09/05/2015 CLINICAL DATA:  Low back pain. The pain is located in the sacrum extends into her left hip. Chronic pain syndrome. EXAM: MRI LUMBAR SPINE WITHOUT CONTRAST TECHNIQUE: Multiplanar, multisequence MR imaging of the lumbar spine was performed. No intravenous contrast was administered. COMPARISON:  MRI lumbar spine 08/23/2013 FINDINGS: Segmentation: 5 non rib-bearing lumbar type vertebral bodies are present. Alignment: AP alignment is anatomic. Mild leftward curvature is present at L5. Rightward curvature is at L2. Vertebrae:  Marrow signal and vertebral body heights are normal. Conus medullaris: Extends to the T12-L1 level and appears normal. Paraspinal and other soft tissues: Limited imaging of the abdomen is unremarkable. Disc levels: L1-2: Mild facet hypertrophy is again noted. There is no significant disc protrusion or stenosis. L2-3: Mild facet hypertrophy is stable. There is some desiccation of the disc which is new. No focal protrusion or stenosis is present. L3-4: A broad-based disc protrusion and moderate facet hypertrophy has progressed. Mild left subarticular and foraminal narrowing has progressed. L4-5: A broad-based disc protrusion is present. A central annular tear is present. Moderate facet  hypertrophy has progressed. Bilateral moderate subarticular and foraminal narrowing has progressed. L5-S1: Mild facet hypertrophy is present bilaterally. There is no significant disc protrusion or stenosis. IMPRESSION: 1. Progression of multilevel spondylosis as described. 2. Disc desiccation at L2-3 is new without focal stenosis. 3. Progressive mild left foraminal and subarticular stenosis at L3-4 due to a leftward disc protrusion and progressive moderate facet hypertrophy. 4. Progressive moderate subarticular and foraminal stenosis bilaterally at L4-5 secondary to progressive moderate broad-based disc protrusion and facet hypertrophy. 5. Mild facet hypertrophy at L5-S1 without significant stenosis. Electronically Signed   By: San Morelle M.D.   On: 09/05/2015 09:43    Assessment & Plan:   Kashvi was seen today for facial swelling.  Diagnoses and all orders for this visit:  Dental abscess -     amoxicillin-clavulanate (AUGMENTIN) 875-125 MG tablet; Take 1 tablet by mouth 2 (two) times daily. -     chlorhexidine (PERIDEX) 0.12 % solution; Use as  directed 15 mLs in the mouth or throat 2 (two) times daily.  Otitis externa of right ear, unspecified chronicity, unspecified type -     amoxicillin-clavulanate (AUGMENTIN) 875-125 MG tablet; Take 1 tablet by mouth 2 (two) times daily. -     chlorhexidine (PERIDEX) 0.12 % solution; Use as directed 15 mLs in the mouth or throat 2 (two) times daily.  Gingivitis due to dental plaque -     amoxicillin-clavulanate (AUGMENTIN) 875-125 MG tablet; Take 1 tablet by mouth 2 (two) times daily. -     chlorhexidine (PERIDEX) 0.12 % solution; Use as directed 15 mLs in the mouth or throat 2 (two) times daily.   I have discontinued Candiss Norse. Ybarra's omeprazole, minocycline, ipratropium, SUMAtriptan, EMBEDA, mupirocin ointment, valACYclovir, doxycycline, and NEOMYCIN-POLYMYXIN-HYDROCORTISONE. I am also having her start on amoxicillin-clavulanate and  chlorhexidine. Additionally, I am having her maintain her butalbital-acetaminophen-caffeine, albuterol, gabapentin, cyclobenzaprine, HYDROcodone-acetaminophen, amitriptyline, DULoxetine, and metoprolol tartrate.  Meds ordered this encounter  Medications  . amoxicillin-clavulanate (AUGMENTIN) 875-125 MG tablet    Sig: Take 1 tablet by mouth 2 (two) times daily.    Dispense:  20 tablet    Refill:  0    Order Specific Question:   Supervising Provider    Answer:   Anitra Lauth, PHILIP H [[id number]]  . chlorhexidine (PERIDEX) 0.12 % solution    Sig: Use as directed 15 mLs in the mouth or throat 2 (two) times daily.    Dispense:  120 mL    Refill:  0    Order Specific Question:   Supervising Provider    Answer:   Tammi Sou [[id number]]    Follow-up: No Follow-up on file.  Wilfred Lacy, NP

## 2017-05-01 DIAGNOSIS — M545 Low back pain: Secondary | ICD-10-CM | POA: Diagnosis not present

## 2017-05-01 DIAGNOSIS — G894 Chronic pain syndrome: Secondary | ICD-10-CM | POA: Diagnosis not present

## 2017-05-01 DIAGNOSIS — M5127 Other intervertebral disc displacement, lumbosacral region: Secondary | ICD-10-CM | POA: Diagnosis not present

## 2017-05-08 ENCOUNTER — Other Ambulatory Visit: Payer: Self-pay | Admitting: Internal Medicine

## 2017-05-08 MED ORDER — DULOXETINE HCL 60 MG PO CPEP: ORAL_CAPSULE | ORAL | 3 refills | Status: DC

## 2017-07-29 DIAGNOSIS — M545 Low back pain: Secondary | ICD-10-CM | POA: Diagnosis not present

## 2017-07-29 DIAGNOSIS — G894 Chronic pain syndrome: Secondary | ICD-10-CM | POA: Diagnosis not present

## 2017-07-29 DIAGNOSIS — M5127 Other intervertebral disc displacement, lumbosacral region: Secondary | ICD-10-CM | POA: Diagnosis not present

## 2017-07-29 DIAGNOSIS — Z79899 Other long term (current) drug therapy: Secondary | ICD-10-CM | POA: Diagnosis not present

## 2017-07-29 DIAGNOSIS — Z6839 Body mass index (BMI) 39.0-39.9, adult: Secondary | ICD-10-CM | POA: Diagnosis not present

## 2017-09-04 ENCOUNTER — Other Ambulatory Visit (INDEPENDENT_AMBULATORY_CARE_PROVIDER_SITE_OTHER): Payer: BLUE CROSS/BLUE SHIELD

## 2017-09-04 ENCOUNTER — Other Ambulatory Visit: Payer: Self-pay | Admitting: Internal Medicine

## 2017-09-04 ENCOUNTER — Encounter: Payer: Self-pay | Admitting: Internal Medicine

## 2017-09-04 ENCOUNTER — Ambulatory Visit: Payer: BLUE CROSS/BLUE SHIELD | Admitting: Internal Medicine

## 2017-09-04 VITALS — BP 124/82 | HR 78 | Temp 97.9°F | Ht 70.0 in | Wt 272.0 lb

## 2017-09-04 DIAGNOSIS — R739 Hyperglycemia, unspecified: Secondary | ICD-10-CM | POA: Diagnosis not present

## 2017-09-04 DIAGNOSIS — E785 Hyperlipidemia, unspecified: Secondary | ICD-10-CM

## 2017-09-04 DIAGNOSIS — F411 Generalized anxiety disorder: Secondary | ICD-10-CM

## 2017-09-04 DIAGNOSIS — Z0000 Encounter for general adult medical examination without abnormal findings: Secondary | ICD-10-CM | POA: Diagnosis not present

## 2017-09-04 DIAGNOSIS — Z1159 Encounter for screening for other viral diseases: Secondary | ICD-10-CM

## 2017-09-04 DIAGNOSIS — E039 Hypothyroidism, unspecified: Secondary | ICD-10-CM

## 2017-09-04 DIAGNOSIS — Z114 Encounter for screening for human immunodeficiency virus [HIV]: Secondary | ICD-10-CM | POA: Diagnosis not present

## 2017-09-04 DIAGNOSIS — I10 Essential (primary) hypertension: Secondary | ICD-10-CM

## 2017-09-04 LAB — CBC WITH DIFFERENTIAL/PLATELET
BASOS ABS: 0.1 10*3/uL (ref 0.0–0.1)
Basophils Relative: 1 % (ref 0.0–3.0)
EOS PCT: 2.5 % (ref 0.0–5.0)
Eosinophils Absolute: 0.2 10*3/uL (ref 0.0–0.7)
HCT: 42.1 % (ref 36.0–46.0)
Hemoglobin: 14.4 g/dL (ref 12.0–15.0)
Lymphocytes Relative: 42.8 % (ref 12.0–46.0)
Lymphs Abs: 3.5 10*3/uL (ref 0.7–4.0)
MCHC: 34.2 g/dL (ref 30.0–36.0)
MCV: 95 fl (ref 78.0–100.0)
MONOS PCT: 6.9 % (ref 3.0–12.0)
Monocytes Absolute: 0.6 10*3/uL (ref 0.1–1.0)
NEUTROS ABS: 3.9 10*3/uL (ref 1.4–7.7)
Neutrophils Relative %: 46.8 % (ref 43.0–77.0)
Platelets: 216 10*3/uL (ref 150.0–400.0)
RBC: 4.44 Mil/uL (ref 3.87–5.11)
RDW: 13.2 % (ref 11.5–15.5)
WBC: 8.3 10*3/uL (ref 4.0–10.5)

## 2017-09-04 LAB — BASIC METABOLIC PANEL
BUN: 9 mg/dL (ref 6–23)
CALCIUM: 9.4 mg/dL (ref 8.4–10.5)
CO2: 30 mEq/L (ref 19–32)
CREATININE: 1.07 mg/dL (ref 0.40–1.20)
Chloride: 104 mEq/L (ref 96–112)
GFR: 56.42 mL/min — ABNORMAL LOW (ref >60.00)
Glucose, Bld: 103 mg/dL — ABNORMAL HIGH (ref 70–99)
Potassium: 3.8 mEq/L (ref 3.5–5.1)
Sodium: 141 mEq/L (ref 135–145)

## 2017-09-04 LAB — URINALYSIS, ROUTINE W REFLEX MICROSCOPIC
Bilirubin Urine: NEGATIVE
HGB URINE DIPSTICK: NEGATIVE
Ketones, ur: NEGATIVE
Nitrite: NEGATIVE
SPECIFIC GRAVITY, URINE: 1.015 (ref 1.000–1.030)
TOTAL PROTEIN, URINE-UPE24: NEGATIVE
UROBILINOGEN UA: 0.2 (ref 0.0–1.0)
Urine Glucose: NEGATIVE
pH: 6 (ref 5.0–8.0)

## 2017-09-04 LAB — LIPID PANEL
CHOLESTEROL: 241 mg/dL — AB (ref 0–200)
HDL: 39.4 mg/dL (ref >39.00)
NonHDL: 201.12
TRIGLYCERIDES: 296 mg/dL — AB (ref 0.0–149.0)
Total CHOL/HDL Ratio: 6
VLDL: 59.2 mg/dL — ABNORMAL HIGH (ref 0.0–40.0)

## 2017-09-04 LAB — HEPATIC FUNCTION PANEL
ALBUMIN: 4.2 g/dL (ref 3.5–5.2)
ALK PHOS: 90 U/L (ref 39–117)
ALT: 36 U/L — ABNORMAL HIGH (ref 0–35)
AST: 25 U/L (ref 0–37)
Bilirubin, Direct: 0 mg/dL (ref 0.0–0.3)
TOTAL PROTEIN: 6.9 g/dL (ref 6.0–8.3)
Total Bilirubin: 0.3 mg/dL (ref 0.2–1.2)

## 2017-09-04 LAB — LDL CHOLESTEROL, DIRECT: LDL DIRECT: 148 mg/dL

## 2017-09-04 LAB — HEMOGLOBIN A1C: Hgb A1c MFr Bld: 5.8 % (ref 4.6–6.5)

## 2017-09-04 LAB — TSH: TSH: 9.3 u[IU]/mL — ABNORMAL HIGH (ref 0.35–4.50)

## 2017-09-04 MED ORDER — LEVOTHYROXINE SODIUM 50 MCG PO TABS: 50.0000 ug | ORAL_TABLET | Freq: Every day | ORAL | 3 refills | Status: DC

## 2017-09-04 MED ORDER — ATORVASTATIN CALCIUM 20 MG PO TABS: 20.0000 mg | ORAL_TABLET | Freq: Every day | ORAL | 3 refills | Status: DC

## 2017-09-04 MED ORDER — CLONAZEPAM 0.5 MG PO TABS: 0.5000 mg | ORAL_TABLET | Freq: Two times a day (BID) | ORAL | 2 refills | Status: DC | PRN

## 2017-09-04 NOTE — Patient Instructions (Addendum)
You will be contacted regarding the referral for: colonoscopy  Please call [facility] for your yearly mammogram  Please take all new medication as prescribed  - the klonopin for nerves  Please continue all other medications as before, and refills have been done if requested.  Please have the pharmacy call with any other refills you may need.  Please continue your efforts at being more active, low cholesterol diet, and weight control.  You are otherwise up to date with prevention measures today.  Please keep your appointments with your specialists as you may have planned  Please go to the LAB in the Basement (turn left off the elevator) for the tests to be done today  You will be contacted by phone if any changes need to be made immediately.  Otherwise, you will receive a letter about your results with an explanation, but please check with MyChart first.  Please remember to sign up for MyChart if you have not done so, as this will be important to you in the future with finding out test results, communicating by private email, and scheduling acute appointments online when needed.  Please return in 6 months, or sooner if needed

## 2017-09-04 NOTE — Assessment & Plan Note (Signed)
stable overall by history and exam, recent data reviewed with pt, and pt to continue medical treatment as before,  to f/u any worsening symptoms or concerns BP Readings from Last 3 Encounters:  09/04/17 124/82  04/12/17 130/90  07/24/16 114/76

## 2017-09-04 NOTE — Assessment & Plan Note (Signed)
To cont cymbalta but add klonopin bid prn,  to f/u any worsening symptoms or concerns

## 2017-09-04 NOTE — Assessment & Plan Note (Signed)

## 2017-09-04 NOTE — Progress Notes (Signed)
Subjective:    Patient ID: [name], female    DOB: November 20, 1960, 57 y.o.   MRN: [id number]  HPI  Here for wellness and f/u;  Overall doing ok;  Pt denies Chest pain, worsening SOB, DOE, wheezing, orthopnea, PND, worsening LE edema, palpitations, dizziness or syncope.  Pt denies neurological change such as new headache, facial or extremity weakness.  Pt denies polydipsia, polyuria, or low sugar symptoms. Pt states overall good compliance with treatment and medications, good tolerability, and has been trying to follow appropriate diet.  Pt has had mild worsening depressive symptoms without suicidal ideation but occasional panic several times per wk now. . No fever, night sweats, wt loss, loss of appetite, or other constitutional symptoms.  Pt states good ability with ADL's, has low fall risk, home safety reviewed and adequate, no other significant changes in hearing or vision, and not active with exercise. Pt continues to have recurring LBP without change in severity, bowel or bladder change, fever, wt loss,  worsening LE pain/numbness/weakness, gait change or falls.   Past Medical History:  Diagnosis Date  . ANXIETY   . ASTHMA   . BACK PAIN    Recurrent  . DEPRESSION   . Headache(784.0)    Migrane  . HYPERLIPIDEMIA   . Hypertension   . Mitral valve prolapse   . PAIN, CHRONIC NEC   . Personal history of colonic adenomas 09/02/2012  . SYMPTOM, PALPITATIONS    Past Surgical History:  Procedure Laterality Date  . ABDOMINAL HYSTERECTOMY     1997  . APPENDECTOMY    . CERVICAL FUSION  2003  . CESAREAN SECTION     x2  . FOOT SURGERY     Left foot  . KNEE SURGERY  2000   Right Knee-Cartilage Problem  . OOPHORECTOMY  1996/2000  . TONSILLECTOMY      reports that she has been smoking cigarettes.  She has a 12.50 pack-year smoking history. She has never used smokeless tobacco. She reports that she does not drink alcohol or use drugs. family history includes Arthritis in her father;  COPD in her sister; Cancer in her brother, maternal grandmother, and other; Colon cancer (age of onset: 10) in her maternal uncle; Colon cancer (age of onset: 109) in her maternal uncle; Diabetes in her father, sister, and sister; Heart disease in her mother and sister. Allergies  Allergen Reactions  . Aspirin Hives and Shortness Of Breath  . Sulfonamide Derivatives Swelling   Current Outpatient Medications on File Prior to Visit  Medication Sig Dispense Refill  . albuterol (VENTOLIN HFA) 108 (90 BASE) MCG/ACT inhaler Inhale 2 puffs into the lungs every 6 (six) hours as needed for wheezing. 8.5 Inhaler 6  . amitriptyline (ELAVIL) 100 MG tablet Take 1 tablet by mouth at bedtime.    . butalbital-acetaminophen-caffeine (FIORICET, ESGIC) 50-325-40 MG per tablet Take 1 tablet by mouth every 6 (six) hours as needed. For migraines 20 tablet 1  . chlorhexidine (PERIDEX) 0.12 % solution Use as directed 15 mLs in the mouth or throat 2 (two) times daily. 120 mL 0  . cyclobenzaprine (FLEXERIL) 5 MG tablet Take 1 tablet (5 mg total) by mouth 3 (three) times daily as needed for muscle spasms. 60 tablet 2  . DULoxetine (CYMBALTA) 60 MG capsule TAKE 1 CAPSULE (60 MG TOTAL) BY MOUTH DAILY. 90 capsule 3  . gabapentin (NEURONTIN) 300 MG capsule Take 1 capsule by mouth 3 (three) times daily.    Marland Kitchen HYDROcodone-acetaminophen (NORCO) 7.5-325 MG  tablet as needed.    . metoprolol tartrate (LOPRESSOR) 50 MG tablet Take 1 tablet (50 mg total) by mouth 2 (two) times daily. 180 tablet 1   No current facility-administered medications on file prior to visit.    Review of Systems Constitutional: Negative for other unusual diaphoresis, sweats, appetite or weight changes HENT: Negative for other worsening hearing loss, ear pain, facial swelling, mouth sores or neck stiffness.   Eyes: Negative for other worsening pain, redness or other visual disturbance.  Respiratory: Negative for other stridor or swelling Cardiovascular:  Negative for other palpitations or other chest pain  Gastrointestinal: Negative for worsening diarrhea or loose stools, blood in stool, distention or other pain Genitourinary: Negative for hematuria, flank pain or other change in urine volume.  Musculoskeletal: Negative for myalgias or other joint swelling.  Skin: Negative for other color change, or other wound or worsening drainage.  Neurological: Negative for other syncope or numbness. Hematological: Negative for other adenopathy or swelling Psychiatric/Behavioral: Negative for hallucinations, other worsening agitation, SI, self-injury, or new decreased concentration All other system neg per pt    Objective:   Physical Exam BP 124/82   Pulse 78   Temp 97.9 F (36.6 C) (Oral)   Ht 5\' 10"  (1.778 m)   Wt 272 lb (123.4 kg)   SpO2 98%   BMI 39.03 kg/m  VS noted,  Constitutional: Pt is oriented to person, place, and time. Appears well-developed and well-nourished, in no significant distress and comfortable Head: Normocephalic and atraumatic  Eyes: Conjunctivae and EOM are normal. Pupils are equal, round, and reactive to light Right Ear: External ear normal without discharge Left Ear: External ear normal without discharge Nose: Nose without discharge or deformity Mouth/Throat: Oropharynx is without other ulcerations and moist  Neck: Normal range of motion. Neck supple. No JVD present. No tracheal deviation present or significant neck LA or mass Cardiovascular: Normal rate, regular rhythm, normal heart sounds and intact distal pulses.   Pulmonary/Chest: WOB normal and breath sounds without rales or wheezing  Abdominal: Soft. Bowel sounds are normal. NT. No HSM  Musculoskeletal: Normal range of motion. Exhibits no edema Lymphadenopathy: Has no other cervical adenopathy.  Neurological: Pt is alert and oriented to person, place, and time. Pt has normal reflexes. No cranial nerve deficit. Motor grossly intact, Gait intact Skin: Skin is warm  and dry. No rash noted or new ulcerations Psychiatric:  Has depressed anxious mood and affect. Behavior is normal without agitation No other exam fidnings  Lab Results  Component Value Date   WBC 8.6 07/20/2014   HGB 15.2 (H) 07/20/2014   HCT 44.2 07/20/2014   PLT 216.0 07/20/2014   GLUCOSE 107 (H) 07/20/2014   CHOL 218 (H) 07/20/2014   TRIG 323.0 (H) 07/20/2014   HDL 40.50 07/20/2014   LDLDIRECT 142.0 07/20/2014   LDLCALC 118 (H) 08/12/2008   ALT 76 (H) 07/20/2014   AST 51 (H) 07/20/2014   NA 138 07/20/2014   K 4.0 07/20/2014   CL 104 07/20/2014   CREATININE 1.39 (H) 07/20/2014   BUN 10 07/20/2014   CO2 28 07/20/2014   TSH 8.57 (H) 07/20/2014   HGBA1C 5.7 07/20/2014      Assessment & Plan:

## 2017-09-04 NOTE — Assessment & Plan Note (Signed)
Asympt, for a1c with labs today

## 2017-09-05 LAB — HIV ANTIBODY (ROUTINE TESTING W REFLEX): HIV: NONREACTIVE

## 2017-09-05 LAB — HEPATITIS C ANTIBODY
HEP C AB: NONREACTIVE
SIGNAL TO CUT-OFF: 0.75 (ref <1.00)

## 2017-10-28 DIAGNOSIS — Z6839 Body mass index (BMI) 39.0-39.9, adult: Secondary | ICD-10-CM | POA: Diagnosis not present

## 2017-10-28 DIAGNOSIS — G894 Chronic pain syndrome: Secondary | ICD-10-CM | POA: Diagnosis not present

## 2017-10-28 DIAGNOSIS — M545 Low back pain: Secondary | ICD-10-CM | POA: Diagnosis not present

## 2017-10-28 DIAGNOSIS — M5127 Other intervertebral disc displacement, lumbosacral region: Secondary | ICD-10-CM | POA: Diagnosis not present

## 2017-12-18 ENCOUNTER — Encounter: Payer: Self-pay | Admitting: Internal Medicine

## 2017-12-29 DIAGNOSIS — G894 Chronic pain syndrome: Secondary | ICD-10-CM | POA: Diagnosis not present

## 2017-12-29 DIAGNOSIS — M5127 Other intervertebral disc displacement, lumbosacral region: Secondary | ICD-10-CM | POA: Diagnosis not present

## 2017-12-29 DIAGNOSIS — M545 Low back pain: Secondary | ICD-10-CM | POA: Diagnosis not present

## 2018-01-01 ENCOUNTER — Telehealth: Payer: Self-pay | Admitting: Nurse Practitioner

## 2018-01-01 ENCOUNTER — Other Ambulatory Visit: Payer: Self-pay | Admitting: Orthopaedic Surgery

## 2018-01-01 DIAGNOSIS — M545 Low back pain, unspecified: Secondary | ICD-10-CM

## 2018-01-01 DIAGNOSIS — G8929 Other chronic pain: Secondary | ICD-10-CM

## 2018-01-01 NOTE — Telephone Encounter (Signed)
Phone call to patient to verify medication list and allergies for myelogram procedure. Pt instructed to hold cymbalta and elavil 48hrs prior to myelogram appointment time. Pt verbalized understanding.

## 2018-01-15 ENCOUNTER — Ambulatory Visit
Admission: RE | Admit: 2018-01-15 | Discharge: 2018-01-15 | Disposition: A | Discharge status: Home / Self Care | Payer: BLUE CROSS/BLUE SHIELD | Source: Ambulatory Visit | Attending: Orthopaedic Surgery | Admitting: Orthopaedic Surgery

## 2018-01-15 DIAGNOSIS — M545 Low back pain, unspecified: Secondary | ICD-10-CM

## 2018-01-15 DIAGNOSIS — M48061 Spinal stenosis, lumbar region without neurogenic claudication: Secondary | ICD-10-CM | POA: Diagnosis not present

## 2018-01-15 DIAGNOSIS — G8929 Other chronic pain: Secondary | ICD-10-CM

## 2018-01-15 MED ORDER — ONDANSETRON HCL 4 MG/2ML IJ SOLN: 4.0000 mg | Freq: Four times a day (QID) | INTRAMUSCULAR | Status: DC | PRN

## 2018-01-15 MED ORDER — IOPAMIDOL (ISOVUE-M 200) INJECTION 41%
18.0000 mL | Freq: Once | INTRAMUSCULAR | Status: AC
  Administered 2018-01-15: 18 mL via INTRATHECAL

## 2018-01-15 MED ORDER — DIAZEPAM 5 MG PO TABS
10.0000 mg | ORAL_TABLET | Freq: Once | ORAL | Status: AC
  Administered 2018-01-15: 10 mg via ORAL

## 2018-01-15 NOTE — Discharge Instructions (Signed)
Myelogram Discharge Instructions  1. Go home and rest quietly for the next 24 hours.  It is important to lie flat for the next 24 hours.  Get up only to go to the restroom.  You may lie in the bed or on a couch on your back, your stomach, your left side or your right side.  You may have one pillow under your head.  You may have pillows between your knees while you are on your side or under your knees while you are on your back.  2. DO NOT drive today.  Recline the seat as far back as it will go, while still wearing your seat belt, on the way home.  3. You may get up to go to the bathroom as needed.  You may sit up for 10 minutes to eat.  You may resume your normal diet and medications unless otherwise indicated.  Drink lots of extra fluids today and tomorrow.  4. The incidence of headache, nausea, or vomiting is about 5% (one in 20 patients).  If you develop a headache, lie flat and drink plenty of fluids until the headache goes away.  Caffeinated beverages may be helpful.  If you develop severe nausea and vomiting or a headache that does not go away with flat bed rest, call [phone].  5. You may resume normal activities after your 24 hours of bed rest is over; however, do not exert yourself strongly or do any heavy lifting tomorrow. If when you get up you have a headache when standing, go back to bed and force fluids for another 24 hours.  6. Call your physician for a follow-up appointment.  The results of your myelogram will be sent directly to your physician by the following day.  7. If you have any questions or if complications develop after you arrive home, please call [phone].  Discharge instructions have been explained to the patient.  The patient, or the person responsible for the patient, fully understands these instructions.  YOU MAY RESTART YOUR CYMBALTA AND ELAVIL TOMORROW 01/16/2018 AT 09:30AM.

## 2018-01-20 ENCOUNTER — Ambulatory Visit (INDEPENDENT_AMBULATORY_CARE_PROVIDER_SITE_OTHER): Payer: BLUE CROSS/BLUE SHIELD

## 2018-01-20 DIAGNOSIS — Z23 Encounter for immunization: Secondary | ICD-10-CM | POA: Diagnosis not present

## 2018-01-28 DIAGNOSIS — M542 Cervicalgia: Secondary | ICD-10-CM | POA: Diagnosis not present

## 2018-01-28 DIAGNOSIS — M7062 Trochanteric bursitis, left hip: Secondary | ICD-10-CM | POA: Diagnosis not present

## 2018-01-28 DIAGNOSIS — G894 Chronic pain syndrome: Secondary | ICD-10-CM | POA: Diagnosis not present

## 2018-02-26 ENCOUNTER — Other Ambulatory Visit (INDEPENDENT_AMBULATORY_CARE_PROVIDER_SITE_OTHER): Payer: BLUE CROSS/BLUE SHIELD

## 2018-02-26 ENCOUNTER — Encounter: Payer: Self-pay | Admitting: Internal Medicine

## 2018-02-26 ENCOUNTER — Other Ambulatory Visit: Payer: Self-pay | Admitting: Internal Medicine

## 2018-02-26 ENCOUNTER — Ambulatory Visit: Payer: BLUE CROSS/BLUE SHIELD | Admitting: Internal Medicine

## 2018-02-26 VITALS — BP 132/84 | HR 83 | Temp 98.3°F | Ht 70.0 in | Wt 276.0 lb

## 2018-02-26 DIAGNOSIS — R739 Hyperglycemia, unspecified: Secondary | ICD-10-CM

## 2018-02-26 DIAGNOSIS — I10 Essential (primary) hypertension: Secondary | ICD-10-CM

## 2018-02-26 DIAGNOSIS — F32A Depression, unspecified: Secondary | ICD-10-CM

## 2018-02-26 DIAGNOSIS — T7840XA Allergy, unspecified, initial encounter: Secondary | ICD-10-CM | POA: Diagnosis not present

## 2018-02-26 DIAGNOSIS — E039 Hypothyroidism, unspecified: Secondary | ICD-10-CM

## 2018-02-26 DIAGNOSIS — F329 Major depressive disorder, single episode, unspecified: Secondary | ICD-10-CM | POA: Diagnosis not present

## 2018-02-26 DIAGNOSIS — J309 Allergic rhinitis, unspecified: Secondary | ICD-10-CM

## 2018-02-26 HISTORY — DX: Hypothyroidism, unspecified: E03.9

## 2018-02-26 LAB — BASIC METABOLIC PANEL
BUN: 10 mg/dL (ref 6–23)
CHLORIDE: 104 meq/L (ref 96–112)
CO2: 30 mEq/L (ref 19–32)
Calcium: 9.2 mg/dL (ref 8.4–10.5)
Creatinine, Ser: 1.06 mg/dL (ref 0.40–1.20)
GFR: 56.93 mL/min — ABNORMAL LOW (ref >60.00)
Glucose, Bld: 97 mg/dL (ref 70–99)
POTASSIUM: 4 meq/L (ref 3.5–5.1)
Sodium: 140 mEq/L (ref 135–145)

## 2018-02-26 LAB — HEPATIC FUNCTION PANEL
ALT: 35 U/L (ref 0–35)
AST: 23 U/L (ref 0–37)
Albumin: 4.4 g/dL (ref 3.5–5.2)
Alkaline Phosphatase: 84 U/L (ref 39–117)
BILIRUBIN DIRECT: 0.1 mg/dL (ref 0.0–0.3)
TOTAL PROTEIN: 7.5 g/dL (ref 6.0–8.3)
Total Bilirubin: 0.4 mg/dL (ref 0.2–1.2)

## 2018-02-26 LAB — T4, FREE: FREE T4: 0.53 ng/dL — AB (ref 0.60–1.60)

## 2018-02-26 LAB — LIPID PANEL
CHOL/HDL RATIO: 4
CHOLESTEROL: 188 mg/dL (ref 0–200)
HDL: 46 mg/dL (ref >39.00)
NonHDL: 141.9
TRIGLYCERIDES: 202 mg/dL — AB (ref 0.0–149.0)
VLDL: 40.4 mg/dL — AB (ref 0.0–40.0)

## 2018-02-26 LAB — LDL CHOLESTEROL, DIRECT: LDL DIRECT: 117 mg/dL

## 2018-02-26 LAB — HEMOGLOBIN A1C: Hgb A1c MFr Bld: 5.7 % (ref 4.6–6.5)

## 2018-02-26 LAB — TSH: TSH: 9.49 u[IU]/mL — ABNORMAL HIGH (ref 0.35–4.50)

## 2018-02-26 MED ORDER — DULOXETINE HCL 60 MG PO CPEP: ORAL_CAPSULE | ORAL | 3 refills | Status: DC

## 2018-02-26 MED ORDER — LEVOTHYROXINE SODIUM 75 MCG PO TABS: 75.0000 ug | ORAL_TABLET | Freq: Every day | ORAL | 3 refills | Status: DC

## 2018-02-26 MED ORDER — ONDANSETRON HCL 4 MG PO TABS: 4.0000 mg | ORAL_TABLET | Freq: Three times a day (TID) | ORAL | 0 refills | Status: DC | PRN

## 2018-02-26 MED ORDER — METHYLPREDNISOLONE ACETATE 80 MG/ML IJ SUSP
80.0000 mg | Freq: Once | INTRAMUSCULAR | Status: AC
  Administered 2018-02-26: 80 mg via INTRAMUSCULAR

## 2018-02-26 MED ORDER — CETIRIZINE HCL 10 MG PO TABS: 10.0000 mg | ORAL_TABLET | Freq: Every day | ORAL | 11 refills | Status: DC

## 2018-02-26 MED ORDER — TRIAMCINOLONE ACETONIDE 55 MCG/ACT NA AERO: 2.0000 | INHALATION_SPRAY | Freq: Every day | NASAL | 12 refills | Status: DC

## 2018-02-26 NOTE — Progress Notes (Signed)
Subjective:    Patient ID:  [name], female    DOB: 18-Jul-1960, 57 y.o.   MRN: [id number]  HPI  Here to f/u; overall doing ok,  Pt denies chest pain, increasing sob or doe, wheezing, orthopnea, PND, increased LE swelling, palpitations, dizziness or syncope.  Pt denies new neurological symptoms such as new headache, or facial or extremity weakness or numbness.  Pt denies polydipsia, polyuria, or low sugar episode.  Pt states overall good compliance with meds, mostly trying to follow appropriate diet, with wt overall stable,  but little exercise however  Has overall lost wt in the past yr. But remains with chronic LBP with very slow gait and standing and mod risk of fall, walks with cane, may eventually need further surgury. Has had mild to mod worsening depressive symptoms, but no suicidal ideation, or panic; has ongoing anxiety, not increased recently.   Wt Readings from Last 3 Encounters:  02/26/18 276 lb (125.2 kg)  09/04/17 272 lb (123.4 kg)  04/12/17 288 lb (130.6 kg)  Does have several wks ongoing nasal allergy symptoms with clearish congestion, itch and sneezing, without fever, pain, ST, cough, swelling or wheezing. Past Medical History:  Diagnosis Date  . ANXIETY   . ASTHMA   . BACK PAIN    Recurrent  . DEPRESSION   . Headache(784.0)    Migrane  . HYPERLIPIDEMIA   . Hypertension   . Hypothyroidism 02/26/2018  . Mitral valve prolapse   . PAIN, CHRONIC NEC   . Personal history of colonic adenomas 09/02/2012  . SYMPTOM, PALPITATIONS    Past Surgical History:  Procedure Laterality Date  . ABDOMINAL HYSTERECTOMY     1997  . APPENDECTOMY    . CERVICAL FUSION  2003  . CESAREAN SECTION     x2  . FOOT SURGERY     Left foot  . KNEE SURGERY  2000   Right Knee-Cartilage Problem  . OOPHORECTOMY  1996/2000  . TONSILLECTOMY      reports that she has been smoking cigarettes. She has a 12.50 pack-year smoking history. She has never used smokeless tobacco. She reports that  she does not drink alcohol or use drugs. family history includes Arthritis in her father; COPD in her sister; Cancer in her brother, maternal grandmother, and other; Colon cancer (age of onset: 4) in her maternal uncle; Colon cancer (age of onset: 63) in her maternal uncle; Diabetes in her father, sister, and sister; Heart disease in her mother and sister. Allergies  Allergen Reactions  . Aspirin Hives and Shortness Of Breath  . Sulfonamide Derivatives Swelling   Current Outpatient Medications on File Prior to Visit  Medication Sig Dispense Refill  . albuterol (VENTOLIN HFA) 108 (90 BASE) MCG/ACT inhaler Inhale 2 puffs into the lungs every 6 (six) hours as needed for wheezing. 8.5 Inhaler 6  . amitriptyline (ELAVIL) 100 MG tablet Take 1 tablet by mouth at bedtime.    Marland Kitchen atorvastatin (LIPITOR) 20 MG tablet Take 1 tablet (20 mg total) by mouth daily. 90 tablet 3  . butalbital-acetaminophen-caffeine (FIORICET, ESGIC) 50-325-40 MG per tablet Take 1 tablet by mouth every 6 (six) hours as needed. For migraines 20 tablet 1  . chlorhexidine (PERIDEX) 0.12 % solution Use as directed 15 mLs in the mouth or throat 2 (two) times daily. 120 mL 0  . clonazePAM (KLONOPIN) 0.5 MG tablet Take 1 tablet (0.5 mg total) by mouth 2 (two) times daily as needed for anxiety. 60 tablet 2  . cyclobenzaprine (  FLEXERIL) 5 MG tablet Take 1 tablet (5 mg total) by mouth 3 (three) times daily as needed for muscle spasms. 60 tablet 2  . gabapentin (NEURONTIN) 300 MG capsule Take 1 capsule by mouth 3 (three) times daily.    Marland Kitchen HYDROcodone-acetaminophen (NORCO) 7.5-325 MG tablet as needed.    Marland Kitchen levothyroxine (SYNTHROID, LEVOTHROID) 50 MCG tablet Take 1 tablet (50 mcg total) by mouth daily. 90 tablet 3  . metoprolol tartrate (LOPRESSOR) 50 MG tablet Take 1 tablet (50 mg total) by mouth 2 (two) times daily. 180 tablet 1   No current facility-administered medications on file prior to visit.    Review of Systems  Constitutional:  Negative for other unusual diaphoresis or sweats HENT: Negative for ear discharge or swelling Eyes: Negative for other worsening visual disturbances Respiratory: Negative for stridor or other swelling  Gastrointestinal: Negative for worsening distension or other blood Genitourinary: Negative for retention or other urinary change Musculoskeletal: Negative for other MSK pain or swelling Skin: Negative for color change or other new lesions Neurological: Negative for worsening tremors and other numbness  Psychiatric/Behavioral: Negative for worsening agitation or other fatigue All other system neg per pt    Objective:   Physical Exam BP 132/84   Pulse 83   Temp 98.3 F (36.8 C) (Oral)   Ht 5\' 10"  (1.778 m)   Wt 276 lb (125.2 kg)   SpO2 97%   BMI 39.60 kg/m  VS noted,  Constitutional: Pt appears in NAD HENT: Head: NCAT.  Right Ear: External ear normal.  Left Ear: External ear normal.  Bilat tm's with mild erythema.  Max sinus areas non tender.  Pharynx with mild erythema, no exudate Eyes: . Pupils are equal, round, and reactive to light. Conjunctivae and EOM are normal Nose: without d/c or deformity Neck: Neck supple. Gross normal ROM Cardiovascular: Normal rate and regular rhythm.   Pulmonary/Chest: Effort normal and breath sounds without rales or wheezing.  Abd:  Soft, NT, ND, + BS, no organomegaly Neurological: Pt is alert. At baseline orientation, motor grossly intact Skin: Skin is warm. No rashes, other new lesions, no LE edema Psychiatric: Pt behavior is normal without agitation , + depressed affect and mood No other exam findings  Lab Results  Component Value Date   WBC 8.3 09/04/2017   HGB 14.4 09/04/2017   HCT 42.1 09/04/2017   PLT 216.0 09/04/2017   GLUCOSE 103 (H) 09/04/2017   CHOL 241 (H) 09/04/2017   TRIG 296.0 (H) 09/04/2017   HDL 39.40 09/04/2017   LDLDIRECT 148.0 09/04/2017   LDLCALC 118 (H) 08/12/2008   ALT 36 (H) 09/04/2017   AST 25 09/04/2017   NA  141 09/04/2017   K 3.8 09/04/2017   CL 104 09/04/2017   CREATININE 1.07 09/04/2017   BUN 9 09/04/2017   CO2 30 09/04/2017   TSH 9.30 (H) 09/04/2017   HGBA1C 5.8 09/04/2017        Assessment & Plan:

## 2018-02-26 NOTE — Assessment & Plan Note (Signed)
Mild to mod worsening, for increased cymbalta 60 bid, declines psychiatry referral, ok for counseling referral

## 2018-02-26 NOTE — Patient Instructions (Signed)
You had the steroid shot today for allergies  Please take all new medication as prescribed - the zyrtec and nasacort as directed  OK to increase the cymbalta to 60 mg twice per day  Please continue all other medications as before, and refills have been done if requested.  Please have the pharmacy call with any other refills you may need.  Please continue your efforts at being more active, low cholesterol diet, and weight control.  Please keep your appointments with your specialists as you may have planned  You will be contacted regarding the referral for: Counseling with Psychology   Please go to the LAB in the Basement (turn left off the elevator) for the tests to be done today  You will be contacted by phone if any changes need to be made immediately.  Otherwise, you will receive a letter about your results with an explanation, but please check with MyChart first.  Please remember to sign up for MyChart if you have not done so, as this will be important to you in the future with finding out test results, communicating by private email, and scheduling acute appointments online when needed.  Please return in 6 months, or sooner if needed

## 2018-02-26 NOTE — Assessment & Plan Note (Signed)
stable overall by history and exam, recent data reviewed with pt, and pt to continue medical treatment as before,  to f/u any worsening symptoms or concerns  

## 2018-02-26 NOTE — Assessment & Plan Note (Signed)
Mild to mod, for zyrtec and nasacort asd,  to f/u any worsening symptoms or concerns 

## 2018-03-05 ENCOUNTER — Telehealth: Payer: Self-pay | Admitting: Internal Medicine

## 2018-03-05 NOTE — Telephone Encounter (Signed)
-----   Message from Merril Abbe, [place] sent at 03/05/2018 12:07 PM EST ----- FYI:  Your patient is scheduled to see Myra Gianotti on 04/13/18. Pt is aware.  Thank you for the referral.

## 2018-03-24 DIAGNOSIS — G894 Chronic pain syndrome: Secondary | ICD-10-CM | POA: Diagnosis not present

## 2018-03-24 DIAGNOSIS — M25551 Pain in right hip: Secondary | ICD-10-CM | POA: Diagnosis not present

## 2018-03-24 DIAGNOSIS — M545 Low back pain: Secondary | ICD-10-CM | POA: Diagnosis not present

## 2018-03-30 DIAGNOSIS — M5127 Other intervertebral disc displacement, lumbosacral region: Secondary | ICD-10-CM | POA: Diagnosis not present

## 2018-04-13 ENCOUNTER — Ambulatory Visit: Payer: BLUE CROSS/BLUE SHIELD | Admitting: Psychology

## 2018-05-12 ENCOUNTER — Other Ambulatory Visit: Payer: Self-pay | Admitting: Internal Medicine

## 2018-05-22 DIAGNOSIS — M5127 Other intervertebral disc displacement, lumbosacral region: Secondary | ICD-10-CM | POA: Diagnosis not present

## 2018-05-22 DIAGNOSIS — M5416 Radiculopathy, lumbar region: Secondary | ICD-10-CM | POA: Diagnosis not present

## 2018-05-27 DIAGNOSIS — M5416 Radiculopathy, lumbar region: Secondary | ICD-10-CM | POA: Diagnosis not present

## 2018-06-24 DIAGNOSIS — M545 Low back pain: Secondary | ICD-10-CM | POA: Diagnosis not present

## 2018-06-24 DIAGNOSIS — G894 Chronic pain syndrome: Secondary | ICD-10-CM | POA: Diagnosis not present

## 2018-06-24 DIAGNOSIS — M5416 Radiculopathy, lumbar region: Secondary | ICD-10-CM | POA: Diagnosis not present

## 2018-06-24 DIAGNOSIS — M7918 Myalgia, other site: Secondary | ICD-10-CM | POA: Diagnosis not present

## 2018-08-24 DIAGNOSIS — M791 Myalgia, unspecified site: Secondary | ICD-10-CM | POA: Diagnosis not present

## 2018-08-24 DIAGNOSIS — Z79899 Other long term (current) drug therapy: Secondary | ICD-10-CM | POA: Diagnosis not present

## 2018-08-24 DIAGNOSIS — G894 Chronic pain syndrome: Secondary | ICD-10-CM | POA: Diagnosis not present

## 2018-08-24 DIAGNOSIS — M5416 Radiculopathy, lumbar region: Secondary | ICD-10-CM | POA: Diagnosis not present

## 2018-08-24 DIAGNOSIS — Z6839 Body mass index (BMI) 39.0-39.9, adult: Secondary | ICD-10-CM | POA: Diagnosis not present

## 2018-08-28 ENCOUNTER — Ambulatory Visit (INDEPENDENT_AMBULATORY_CARE_PROVIDER_SITE_OTHER): Payer: BC Managed Care – PPO | Admitting: Internal Medicine

## 2018-08-28 DIAGNOSIS — Z0001 Encounter for general adult medical examination with abnormal findings: Secondary | ICD-10-CM | POA: Diagnosis not present

## 2018-08-28 DIAGNOSIS — E611 Iron deficiency: Secondary | ICD-10-CM | POA: Diagnosis not present

## 2018-08-28 DIAGNOSIS — N6320 Unspecified lump in the left breast, unspecified quadrant: Secondary | ICD-10-CM | POA: Diagnosis not present

## 2018-08-28 DIAGNOSIS — R739 Hyperglycemia, unspecified: Secondary | ICD-10-CM

## 2018-08-28 DIAGNOSIS — N644 Mastodynia: Secondary | ICD-10-CM

## 2018-08-28 DIAGNOSIS — R609 Edema, unspecified: Secondary | ICD-10-CM

## 2018-08-28 DIAGNOSIS — E538 Deficiency of other specified B group vitamins: Secondary | ICD-10-CM

## 2018-08-28 DIAGNOSIS — M25471 Effusion, right ankle: Secondary | ICD-10-CM

## 2018-08-28 DIAGNOSIS — F32A Depression, unspecified: Secondary | ICD-10-CM

## 2018-08-28 DIAGNOSIS — F329 Major depressive disorder, single episode, unspecified: Secondary | ICD-10-CM | POA: Diagnosis not present

## 2018-08-28 DIAGNOSIS — E559 Vitamin D deficiency, unspecified: Secondary | ICD-10-CM | POA: Diagnosis not present

## 2018-08-28 DIAGNOSIS — M25472 Effusion, left ankle: Secondary | ICD-10-CM

## 2018-08-28 DIAGNOSIS — R600 Localized edema: Secondary | ICD-10-CM

## 2018-08-28 MED ORDER — HYDROCHLOROTHIAZIDE 12.5 MG PO CAPS: 12.5000 mg | ORAL_CAPSULE | Freq: Every day | ORAL | 3 refills | Status: DC

## 2018-08-28 NOTE — Progress Notes (Signed)
Patient ID:  [name], female   DOB: 06-Jul-1960, 58 y.o.   MRN: [id number]  Virtual Visit via Video Note  I connected with Candiss Norse Hladik on 08/28/18 at 10:40 AM EDT by a video enabled telemedicine application and verified that I am speaking with the correct person using two identifiers.  Location: Patient: at home Provider: at office   I discussed the limitations of evaluation and management by telemedicine and the availability of in person appointments. The patient expressed understanding and agreed to proceed.  History of Present Illness: Here for wellness and f/u;  Overall doing ok;   Pt denies neurological change such as new headache, facial or extremity weakness.  Pt denies polydipsia, polyuria, or low sugar symptoms. No fever, night sweats, wt loss, loss of appetite, or other constitutional symptoms.  Pt states good ability with ADL's, has low fall risk, home safety reviewed and adequate, no other significant changes in hearing or vision, and only occasionally active with exercise. Also, Pt denies chest pain, increased sob or doe, wheezing, orthopnea, PND, palpitations, dizziness or syncope, except has now 6 days onset unusual bilat ankle edema without pain, not sure about weight gain.   Also,  Pt denies polydipsia, polyuria, or low sugar symptoms such as weakness or confusion improved with po intake.  Pt states overall good compliance with meds, trying to follow lower cholesterol, diabetic diet, and CBGs have been 169 at the highest recently and usually 129-142 in the AM.   Also c/o 1 mo onset recurrently persistent left breast soreness and pain, mild overall without skin change, trauma, fever.  Has a particular lump she has palpated that seems new, not sure if getting larger, and due for mammogram Also, Has had mod to severe worsening depressive symptoms for about 2 mo for unclear reasons, without suicidal ideation, or panic; has ongoing anxiety as well.  She thinks mostly due to  her worsening debility and chronic comorbids.  Declines need for counseling or psychiatric referral Past Medical History:  Diagnosis Date  . ANXIETY   . ASTHMA   . BACK PAIN    Recurrent  . DEPRESSION   . Headache(784.0)    Migrane  . HYPERLIPIDEMIA   . Hypertension   . Hypothyroidism 02/26/2018  . Mitral valve prolapse   . PAIN, CHRONIC NEC   . Personal history of colonic adenomas 09/02/2012  . SYMPTOM, PALPITATIONS    Past Surgical History:  Procedure Laterality Date  . ABDOMINAL HYSTERECTOMY     1997  . APPENDECTOMY    . CERVICAL FUSION  2003  . CESAREAN SECTION     x2  . FOOT SURGERY     Left foot  . KNEE SURGERY  2000   Right Knee-Cartilage Problem  . OOPHORECTOMY  1996/2000  . TONSILLECTOMY      reports that she has been smoking cigarettes. She has a 12.50 pack-year smoking history. She has never used smokeless tobacco. She reports that she does not drink alcohol or use drugs. family history includes Arthritis in her father; COPD in her sister; Cancer in her brother, maternal grandmother, and another family member; Colon cancer (age of onset: 63) in her maternal uncle; Colon cancer (age of onset: 49) in her maternal uncle; Diabetes in her father, sister, and sister; Heart disease in her mother and sister. Allergies  Allergen Reactions  . Aspirin Hives and Shortness Of Breath  . Sulfonamide Derivatives Swelling   Current Outpatient Medications on File Prior to Visit  Medication Sig Dispense  Refill  . albuterol (VENTOLIN HFA) 108 (90 BASE) MCG/ACT inhaler Inhale 2 puffs into the lungs every 6 (six) hours as needed for wheezing. 8.5 Inhaler 6  . amitriptyline (ELAVIL) 100 MG tablet Take 1 tablet by mouth at bedtime.    Marland Kitchen atorvastatin (LIPITOR) 20 MG tablet Take 1 tablet (20 mg total) by mouth daily. 90 tablet 3  . butalbital-acetaminophen-caffeine (FIORICET, ESGIC) 50-325-40 MG per tablet Take 1 tablet by mouth every 6 (six) hours as needed. For migraines 20 tablet 1  .  cetirizine (ZYRTEC) 10 MG tablet Take 1 tablet (10 mg total) by mouth daily. 30 tablet 11  . chlorhexidine (PERIDEX) 0.12 % solution Use as directed 15 mLs in the mouth or throat 2 (two) times daily. 120 mL 0  . clonazePAM (KLONOPIN) 0.5 MG tablet Take 1 tablet (0.5 mg total) by mouth 2 (two) times daily as needed for anxiety. 60 tablet 2  . cyclobenzaprine (FLEXERIL) 5 MG tablet Take 1 tablet (5 mg total) by mouth 3 (three) times daily as needed for muscle spasms. 60 tablet 2  . DULoxetine (CYMBALTA) 60 MG capsule TAKE 1 CAPSULE (60 MG TOTAL) BY MOUTH twice daily 180 capsule 3  . gabapentin (NEURONTIN) 300 MG capsule Take 1 capsule by mouth 3 (three) times daily.    Marland Kitchen HYDROcodone-acetaminophen (NORCO) 7.5-325 MG tablet as needed.    Marland Kitchen levothyroxine (SYNTHROID, LEVOTHROID) 75 MCG tablet Take 1 tablet (75 mcg total) by mouth daily. 90 tablet 3  . metoprolol tartrate (LOPRESSOR) 50 MG tablet TAKE 1 TABLET BY MOUTH TWICE A DAY 180 tablet 1  . ondansetron (ZOFRAN) 4 MG tablet Take 1 tablet (4 mg total) by mouth every 8 (eight) hours as needed for nausea or vomiting. 20 tablet 0  . triamcinolone (NASACORT) 55 MCG/ACT AERO nasal inhaler Place 2 sprays into the nose daily. 1 Inhaler 12   No current facility-administered medications on file prior to visit.     Observations/Objective: Alert, NAD, depressed mood and affect, resps normal, cn 2-12 intact, moves all 4s, no visible rash or swelling Lab Results  Component Value Date   WBC 8.3 09/04/2017   HGB 14.4 09/04/2017   HCT 42.1 09/04/2017   PLT 216.0 09/04/2017   GLUCOSE 97 02/26/2018   CHOL 188 02/26/2018   TRIG 202.0 (H) 02/26/2018   HDL 46.00 02/26/2018   LDLDIRECT 117.0 02/26/2018   LDLCALC 118 (H) 08/12/2008   ALT 35 02/26/2018   AST 23 02/26/2018   NA 140 02/26/2018   K 4.0 02/26/2018   CL 104 02/26/2018   CREATININE 1.06 02/26/2018   BUN 10 02/26/2018   CO2 30 02/26/2018   TSH 9.49 (H) 02/26/2018   HGBA1C 5.7 02/26/2018    Assessment and Plan: See notes  Follow Up Instructions: See notes   I discussed the assessment and treatment plan with the patient. The patient was provided an opportunity to ask questions and all were answered. The patient agreed with the plan and demonstrated an understanding of the instructions.   The patient was advised to call back or seek an in-person evaluation if the symptoms worsen or if the condition fails to improve as anticipated.   Cathlean Cower, MD

## 2018-08-28 NOTE — Patient Instructions (Signed)
Please take all new medication as prescribed - the HCT 12.5 mg per day for swelling  Please continue all other medications as before, and refills have been done if requested.  Please have the pharmacy call with any other refills you may need.  Please continue your efforts at being more active, low cholesterol diet, and weight control.  You are otherwise up to date with prevention measures today.  Please keep your appointments with your specialists as you may have planned  You will be contacted regarding the referral for: colonoscopy, and diagnostic mammogram  Please go to the LAB in the Basement (turn left off the elevator) for the tests to be done Monday as you mentioned  You will be contacted by phone if any changes need to be made immediately.  Otherwise, you will receive a letter about your results with an explanation, but please check with MyChart first.  Please remember to sign up for MyChart if you have not done so, as this will be important to you in the future with finding out test results, communicating by private email, and scheduling acute appointments online when needed.  Please return in 6 months, or sooner if needed, with Lab testing done 3-5 days before

## 2018-08-30 ENCOUNTER — Encounter: Payer: Self-pay | Admitting: Internal Medicine

## 2018-08-30 DIAGNOSIS — I872 Venous insufficiency (chronic) (peripheral): Secondary | ICD-10-CM | POA: Insufficient documentation

## 2018-08-30 DIAGNOSIS — N6320 Unspecified lump in the left breast, unspecified quadrant: Secondary | ICD-10-CM | POA: Insufficient documentation

## 2018-08-30 DIAGNOSIS — R609 Edema, unspecified: Secondary | ICD-10-CM | POA: Insufficient documentation

## 2018-08-30 DIAGNOSIS — N644 Mastodynia: Secondary | ICD-10-CM | POA: Insufficient documentation

## 2018-08-30 NOTE — Assessment & Plan Note (Addendum)
Etiology unclear, for diagnostic mammogram  In addition to the time spent performing CPE, I spent an additional 40 minutes face to face,in which greater than 50% of this time was spent in counseling and coordination of care for patient's acute illness as documented, including the differential dx, treatment, further evaluation and other management of left breast pain, left breast lump, hyperglycemia, depression, peripheral edema

## 2018-08-30 NOTE — Assessment & Plan Note (Signed)
Etiology unclear, for BNP with labs, start hct 12.5 qd,  to f/u any worsening symptoms or concerns

## 2018-08-30 NOTE — Assessment & Plan Note (Signed)
stable overall by history and exam, recent data reviewed with pt, and pt to continue medical treatment as before,  to f/u any worsening symptoms or concerns, for a1c with labs 

## 2018-08-30 NOTE — Assessment & Plan Note (Signed)

## 2018-08-30 NOTE — Assessment & Plan Note (Signed)
Etiology unclear, also for breast u/s with diagnostic mammogram

## 2018-08-30 NOTE — Assessment & Plan Note (Signed)
Seems to be at least moderatly worsening, no SI or HI and no specific external stressors such as family issue though finances are always a challenge; d/w pt but declines any change in tx or referrals for counseling or psychiatry

## 2018-08-31 ENCOUNTER — Other Ambulatory Visit: Payer: Self-pay | Admitting: Internal Medicine

## 2018-08-31 ENCOUNTER — Other Ambulatory Visit (INDEPENDENT_AMBULATORY_CARE_PROVIDER_SITE_OTHER): Payer: BC Managed Care – PPO

## 2018-08-31 DIAGNOSIS — M25472 Effusion, left ankle: Secondary | ICD-10-CM

## 2018-08-31 DIAGNOSIS — R739 Hyperglycemia, unspecified: Secondary | ICD-10-CM

## 2018-08-31 DIAGNOSIS — E559 Vitamin D deficiency, unspecified: Secondary | ICD-10-CM | POA: Diagnosis not present

## 2018-08-31 DIAGNOSIS — Z0001 Encounter for general adult medical examination with abnormal findings: Secondary | ICD-10-CM

## 2018-08-31 DIAGNOSIS — M25471 Effusion, right ankle: Secondary | ICD-10-CM

## 2018-08-31 DIAGNOSIS — E538 Deficiency of other specified B group vitamins: Secondary | ICD-10-CM | POA: Diagnosis not present

## 2018-08-31 DIAGNOSIS — E611 Iron deficiency: Secondary | ICD-10-CM | POA: Diagnosis not present

## 2018-08-31 DIAGNOSIS — E039 Hypothyroidism, unspecified: Secondary | ICD-10-CM

## 2018-08-31 DIAGNOSIS — N6320 Unspecified lump in the left breast, unspecified quadrant: Secondary | ICD-10-CM

## 2018-08-31 DIAGNOSIS — N644 Mastodynia: Secondary | ICD-10-CM

## 2018-08-31 LAB — IBC PANEL
Iron: 60 ug/dL (ref 42–145)
Saturation Ratios: 16.2 % — ABNORMAL LOW (ref 20.0–50.0)
Transferrin: 264 mg/dL (ref 212.0–360.0)

## 2018-08-31 LAB — CBC WITH DIFFERENTIAL/PLATELET
Basophils Absolute: 0.1 10*3/uL (ref 0.0–0.1)
Basophils Relative: 0.8 % (ref 0.0–3.0)
Eosinophils Absolute: 0.2 10*3/uL (ref 0.0–0.7)
Eosinophils Relative: 1.8 % (ref 0.0–5.0)
HCT: 44.2 % (ref 36.0–46.0)
Hemoglobin: 15.1 g/dL — ABNORMAL HIGH (ref 12.0–15.0)
Lymphocytes Relative: 39.3 % (ref 12.0–46.0)
Lymphs Abs: 3.6 10*3/uL (ref 0.7–4.0)
MCHC: 34.1 g/dL (ref 30.0–36.0)
MCV: 96 fl (ref 78.0–100.0)
Monocytes Absolute: 0.6 10*3/uL (ref 0.1–1.0)
Monocytes Relative: 6.7 % (ref 3.0–12.0)
Neutro Abs: 4.7 10*3/uL (ref 1.4–7.7)
Neutrophils Relative %: 51.4 % (ref 43.0–77.0)
Platelets: 198 10*3/uL (ref 150.0–400.0)
RBC: 4.61 Mil/uL (ref 3.87–5.11)
RDW: 12.7 % (ref 11.5–15.5)
WBC: 9.1 10*3/uL (ref 4.0–10.5)

## 2018-08-31 LAB — VITAMIN B12: Vitamin B-12: 142 pg/mL — ABNORMAL LOW (ref 211–911)

## 2018-08-31 LAB — URINALYSIS, ROUTINE W REFLEX MICROSCOPIC
Bilirubin Urine: NEGATIVE
Hgb urine dipstick: NEGATIVE
Ketones, ur: NEGATIVE
Leukocytes,Ua: NEGATIVE
Nitrite: NEGATIVE
RBC / HPF: NONE SEEN (ref 0–?)
Specific Gravity, Urine: >=1.03 — AB (ref 1.000–1.030)
Total Protein, Urine: NEGATIVE
Urine Glucose: NEGATIVE
Urobilinogen, UA: 0.2 (ref 0.0–1.0)
pH: 6 (ref 5.0–8.0)

## 2018-08-31 LAB — T4, FREE: Free T4: 0.78 ng/dL (ref 0.60–1.60)

## 2018-08-31 LAB — BASIC METABOLIC PANEL
BUN: 14 mg/dL (ref 6–23)
CO2: 29 mEq/L (ref 19–32)
Calcium: 9.5 mg/dL (ref 8.4–10.5)
Chloride: 102 mEq/L (ref 96–112)
Creatinine, Ser: 0.98 mg/dL (ref 0.40–1.20)
GFR: 58.54 mL/min — ABNORMAL LOW (ref >60.00)
Glucose, Bld: 112 mg/dL — ABNORMAL HIGH (ref 70–99)
Potassium: 4.1 mEq/L (ref 3.5–5.1)
Sodium: 139 mEq/L (ref 135–145)

## 2018-08-31 LAB — VITAMIN D 25 HYDROXY (VIT D DEFICIENCY, FRACTURES): VITD: 12.91 ng/mL — ABNORMAL LOW (ref 30.00–100.00)

## 2018-08-31 LAB — HEPATIC FUNCTION PANEL
ALT: 25 U/L (ref 0–35)
AST: 19 U/L (ref 0–37)
Albumin: 4.4 g/dL (ref 3.5–5.2)
Alkaline Phosphatase: 76 U/L (ref 39–117)
Bilirubin, Direct: 0.1 mg/dL (ref 0.0–0.3)
Total Bilirubin: 0.5 mg/dL (ref 0.2–1.2)
Total Protein: 7.1 g/dL (ref 6.0–8.3)

## 2018-08-31 LAB — TSH: TSH: 5.37 u[IU]/mL — ABNORMAL HIGH (ref 0.35–4.50)

## 2018-08-31 LAB — LIPID PANEL
Cholesterol: 232 mg/dL — ABNORMAL HIGH (ref 0–200)
HDL: 40.4 mg/dL (ref >39.00)
NonHDL: 191.39
Total CHOL/HDL Ratio: 6
Triglycerides: 213 mg/dL — ABNORMAL HIGH (ref 0.0–149.0)
VLDL: 42.6 mg/dL — ABNORMAL HIGH (ref 0.0–40.0)

## 2018-08-31 LAB — HEMOGLOBIN A1C: Hgb A1c MFr Bld: 5.6 % (ref 4.6–6.5)

## 2018-08-31 LAB — LDL CHOLESTEROL, DIRECT: Direct LDL: 161 mg/dL

## 2018-08-31 LAB — BRAIN NATRIURETIC PEPTIDE: Pro B Natriuretic peptide (BNP): 20 pg/mL (ref 0.0–100.0)

## 2018-08-31 MED ORDER — VITAMIN D (ERGOCALCIFEROL) 1.25 MG (50000 UNIT) PO CAPS: 50000.0000 [IU] | ORAL_CAPSULE | ORAL | 0 refills | Status: DC

## 2018-09-01 ENCOUNTER — Telehealth: Payer: Self-pay

## 2018-09-01 NOTE — Telephone Encounter (Signed)
Called pt, no VM.

## 2018-09-01 NOTE — Telephone Encounter (Signed)
-----   Message from Biagio Borg, MD sent at 08/31/2018 12:59 PM EDT ----- Left message on MyChart, pt to cont same tx except  The test results show that your current treatment is OK, except the Vitamin D and the Vitamin B12 levels are low.    We need to: 1) Please take Vitamin D 50000 units weekly for 12 weeks, then plan to change to OTC Vitamin D3 at 2000 units per day, indefinitely. 2)  Start B12 shots monthly at the office until your next visit when we may be able to change to b12 pills..    There is no other need for change of treatment or further evaluation based on these results, at this time.    Shirron to please inform pt, I will do rx and have pt start monthly b12 shots

## 2018-09-03 ENCOUNTER — Other Ambulatory Visit: Payer: BC Managed Care – PPO

## 2018-09-15 ENCOUNTER — Encounter: Payer: Self-pay | Admitting: Internal Medicine

## 2018-10-21 DIAGNOSIS — M5106 Intervertebral disc disorders with myelopathy, lumbar region: Secondary | ICD-10-CM | POA: Diagnosis not present

## 2018-10-21 DIAGNOSIS — Z6839 Body mass index (BMI) 39.0-39.9, adult: Secondary | ICD-10-CM | POA: Diagnosis not present

## 2018-10-21 DIAGNOSIS — G894 Chronic pain syndrome: Secondary | ICD-10-CM | POA: Diagnosis not present

## 2018-10-21 DIAGNOSIS — M7062 Trochanteric bursitis, left hip: Secondary | ICD-10-CM | POA: Diagnosis not present

## 2018-10-21 DIAGNOSIS — M5416 Radiculopathy, lumbar region: Secondary | ICD-10-CM | POA: Diagnosis not present

## 2018-10-21 DIAGNOSIS — I10 Essential (primary) hypertension: Secondary | ICD-10-CM | POA: Diagnosis not present

## 2018-11-16 ENCOUNTER — Other Ambulatory Visit: Payer: Self-pay | Admitting: Internal Medicine

## 2018-11-23 IMAGING — XA DG MYELOGRAPHY LUMBAR INJ LUMBOSACRAL
6 of 17 series · 6 of 17 positions shown · non-contrast
Comparison: Lumbar spine MRI 09/05/2015

CLINICAL DATA: Left low back pain radiating into the lateral aspect
of the left hip and occasionally into the left leg with numbness.
TECHNIQUE: Contiguous axial images were obtained through the Lumbar spine after
the intrathecal infusion of infusion. Coronal and sagittal
reconstructions were obtained of the axial image sets.

[Series 1: vasc standard · 1 of 1 slices shown (1 of 3)]
[im 1/1]
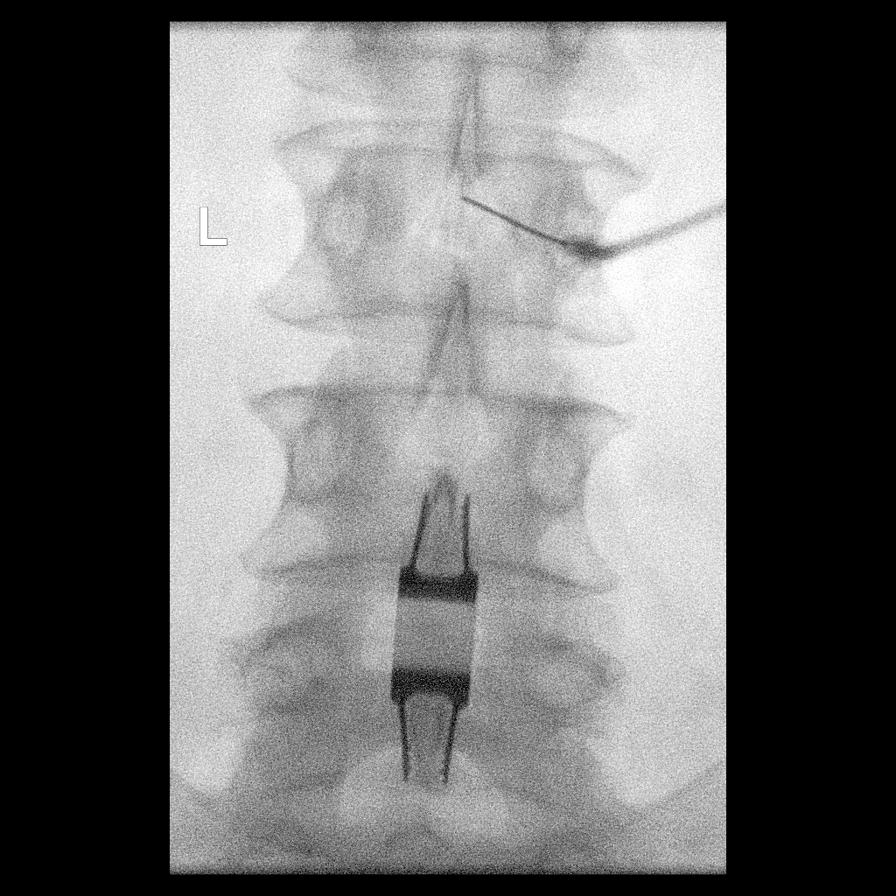

[Series 1: w lumbar spine lat · 0.15mm/px · 1 of 1 slices shown]
[im 1/1]
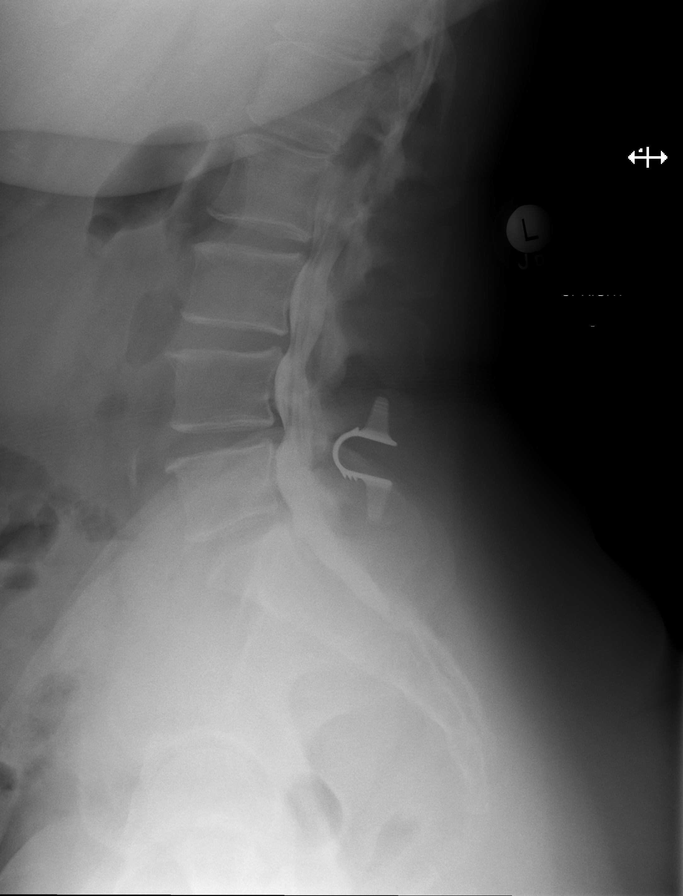

[Series 2: w lumbar spine flexion · 0.15mm/px · 1 of 1 slices shown]
[im 1/1]
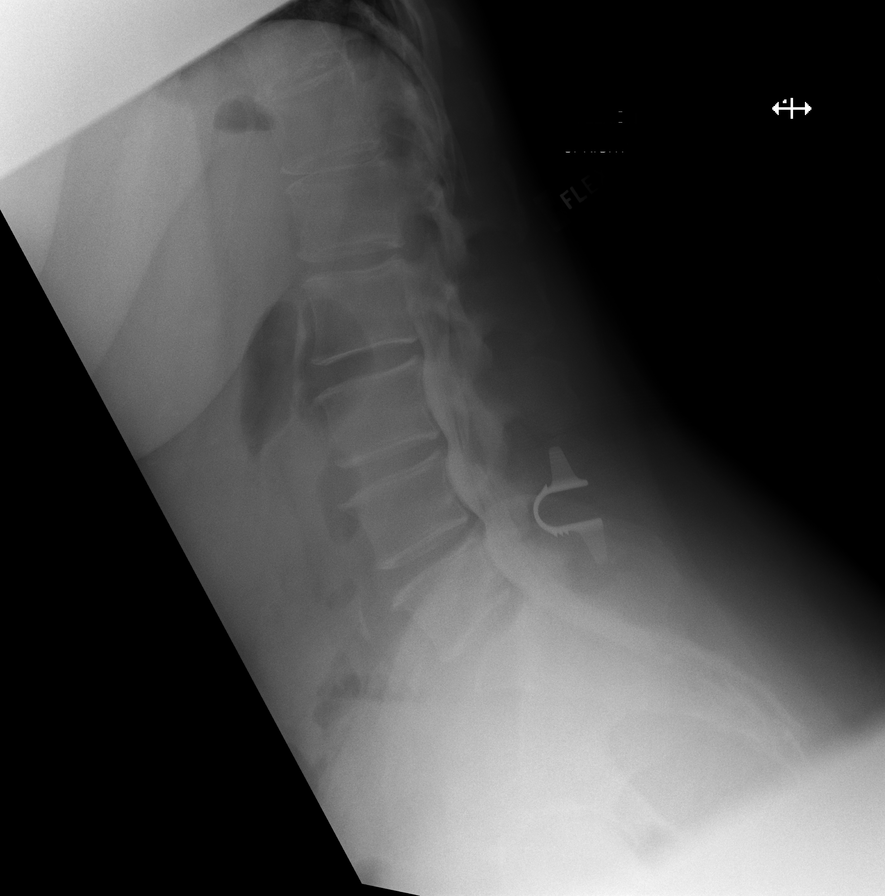

[Series 2: vasc standard · 1 of 1 slices shown (2 of 3)]
[im 1/1]
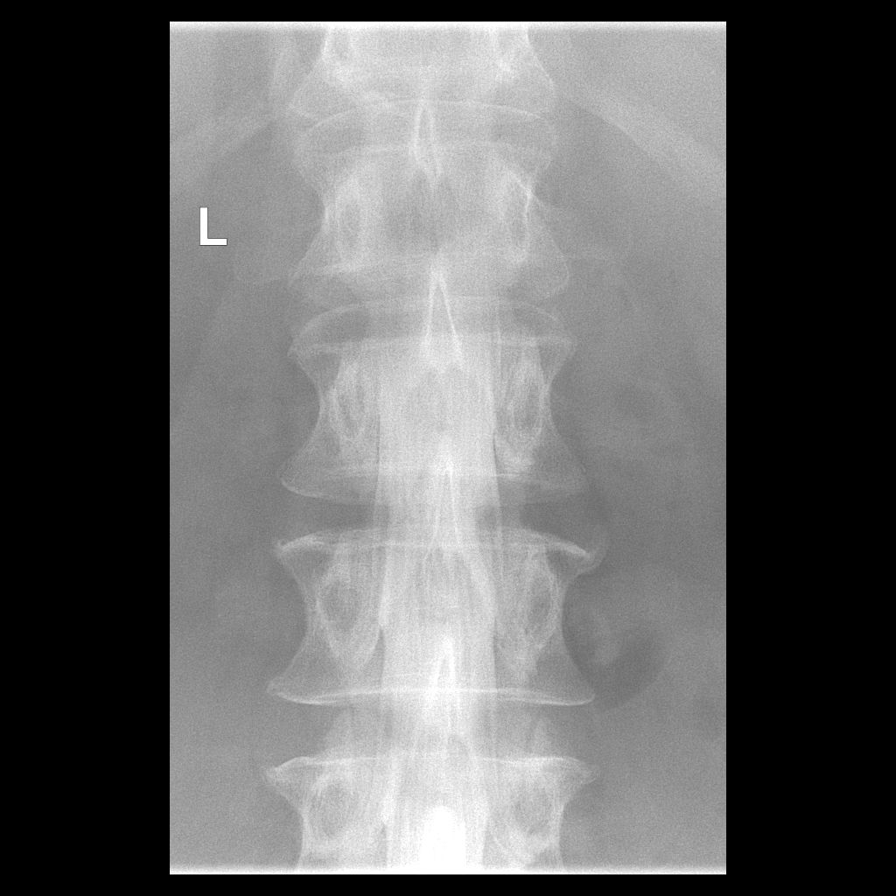

[Series 3: vasc standard · 1 of 1 slices shown (3 of 3)]
[im 1/1]
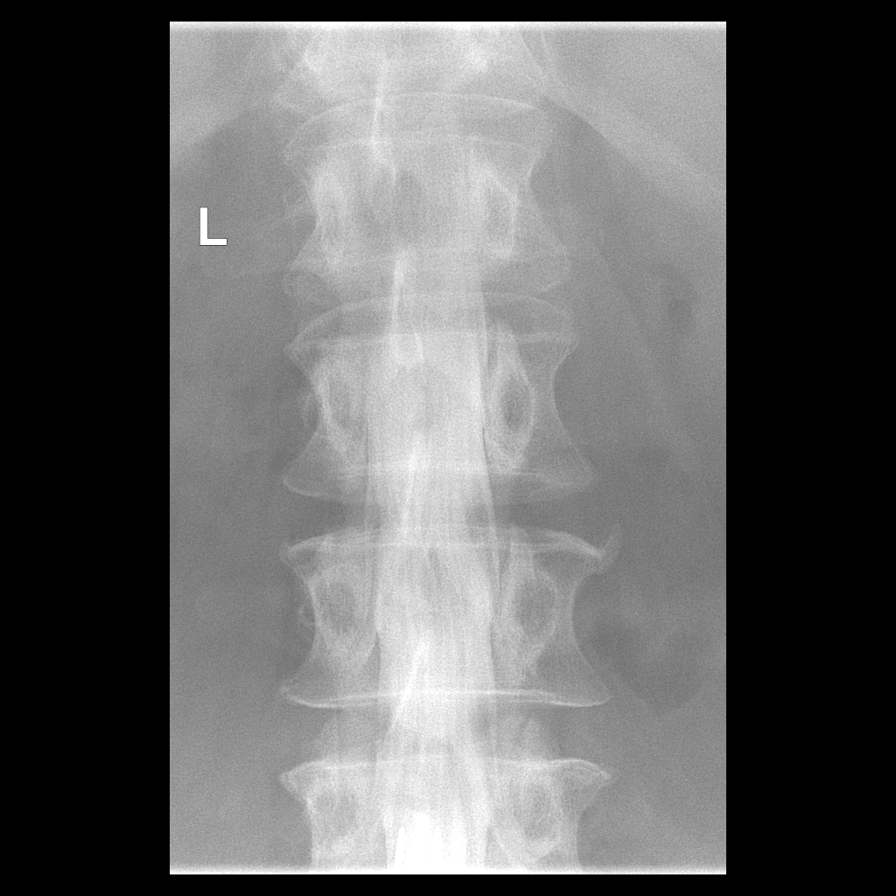

[Series 3: w lumbar spine extension · 0.15mm/px · 1 of 1 slices shown]
[im 1/1]
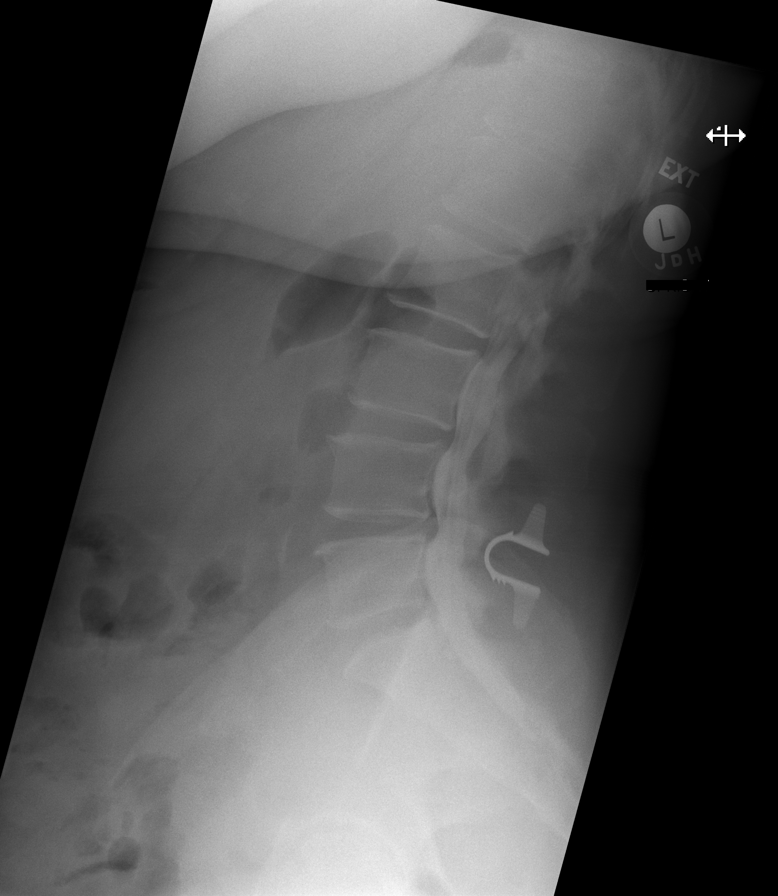

[6 of 17 positions shown; findings below may reference images not displayed]

EXAM:
LUMBAR MYELOGRAM

FLUOROSCOPY TIME:  Radiation Exposure Index (as provided by the
fluoroscopic device): 209.99 microGray*m^2

Fluoroscopy Time (in minutes and seconds):  13 seconds

PROCEDURE:
After thorough discussion of risks and benefits of the procedure
including bleeding, infection, injury to nerves, blood vessels,
adjacent structures as well as headache and CSF leak, written and
oral informed consent was obtained. Consent was obtained by Dr.
Ibinu Mgar. Time out form was completed.

Patient was positioned prone on the fluoroscopy table. Local
anesthesia was provided with 1% lidocaine without epinephrine after
prepped and draped in the usual sterile fashion. Puncture was
performed at L2-3 using a 3 1/2 inch 22-gauge spinal needle via a
right interlaminar approach. Using a single pass through the dura,
the needle was placed within the thecal sac, with return of clear
CSF. 15 mL of Isovue [id number] was injected into the thecal sac, with
normal opacification of the nerve roots and cauda equina consistent
with free flow within the subarachnoid space.

I personally performed the lumbar puncture and administered the
intrathecal contrast. I also personally supervised acquisition of
the myelogram images.
FINDINGS: LUMBAR MYELOGRAM FINDINGS:

There are 5 non rib-bearing lumbar type vertebrae. There is trace
retrolisthesis of L3 on L4 which does not change with flexion or
extension. Interlaminar stabilization has been performed in the
interim at L4-5 with placement of a Coflex device. A ventral
extradural defect is present at L4-5 with evidence of bilateral
lateral recess narrowing. A smaller ventral extradural defect is
noted at L3-4. Neither of these extradural defects significantly
enlarge with standing. No high-grade spinal stenosis is evident.

CT LUMBAR MYELOGRAM FINDINGS:

There is trace retrolisthesis of L3 on L4, stable to at most
minimally more prominent than on the prior MRI. No fracture or
suspicious osseous lesion is identified. Mild disc space narrowing
and degenerative endplate changes are noted at T11-12 and T12-L1
with small Schmorl's nodes. The conus medullaris terminates at L1.
There is mild abdominal aortic atherosclerosis without aneurysm.

T11-12: Minimal disc bulging without stenosis, unchanged.

T12-L1: Broad, partially calcified left subarticular disc protrusion
results in mild left lateral recess narrowing without spinal or
neural foraminal stenosis, unchanged.

L1-2: Minimal facet arthrosis without disc herniation or stenosis,
unchanged.

L2-3: Mild disc bulging and minimal facet arthrosis without
stenosis, unchanged.

L3-4: Mild circumferential disc bulging greater to the left and mild
facet and ligamentum flavum hypertrophy result in mild left neural
foraminal stenosis without significant spinal stenosis, unchanged.

L4-5: Interval laminotomy and Coflex device placement.
Circumferential disc bulging and mild facet hypertrophy result in
improved mild bilateral lateral recess stenosis and unchanged mild
bilateral neural foraminal stenosis without spinal stenosis.

L5-S1: Minimal disc bulging, mild left foraminal endplate spurring,
and mild facet arthrosis without stenosis, unchanged.
IMPRESSION: 1. Interval surgery at L4-5 with mild residual bilateral lateral
recess and neural foraminal narrowing.
2. Unchanged mild left neural foraminal stenosis at L3-4.
3. Unchanged mild left lateral recess stenosis at T12-L1.

Aortic Atherosclerosis ([id number]-LN3.3).

## 2018-12-22 DIAGNOSIS — M7062 Trochanteric bursitis, left hip: Secondary | ICD-10-CM | POA: Diagnosis not present

## 2018-12-22 DIAGNOSIS — M5106 Intervertebral disc disorders with myelopathy, lumbar region: Secondary | ICD-10-CM | POA: Diagnosis not present

## 2018-12-22 DIAGNOSIS — Z6839 Body mass index (BMI) 39.0-39.9, adult: Secondary | ICD-10-CM | POA: Diagnosis not present

## 2018-12-22 DIAGNOSIS — G894 Chronic pain syndrome: Secondary | ICD-10-CM | POA: Diagnosis not present

## 2018-12-22 DIAGNOSIS — M5416 Radiculopathy, lumbar region: Secondary | ICD-10-CM | POA: Diagnosis not present

## 2019-02-22 DIAGNOSIS — M5416 Radiculopathy, lumbar region: Secondary | ICD-10-CM | POA: Diagnosis not present

## 2019-02-22 DIAGNOSIS — G894 Chronic pain syndrome: Secondary | ICD-10-CM | POA: Diagnosis not present

## 2019-02-22 DIAGNOSIS — M5106 Intervertebral disc disorders with myelopathy, lumbar region: Secondary | ICD-10-CM | POA: Diagnosis not present

## 2019-04-08 ENCOUNTER — Encounter: Payer: Self-pay | Admitting: Internal Medicine

## 2019-04-08 ENCOUNTER — Ambulatory Visit (INDEPENDENT_AMBULATORY_CARE_PROVIDER_SITE_OTHER): Payer: BC Managed Care – PPO | Admitting: Internal Medicine

## 2019-04-08 ENCOUNTER — Other Ambulatory Visit: Payer: Self-pay

## 2019-04-08 VITALS — BP 180/104 | HR 95 | Temp 98.4°F | Ht 70.0 in | Wt 280.0 lb

## 2019-04-08 DIAGNOSIS — F411 Generalized anxiety disorder: Secondary | ICD-10-CM

## 2019-04-08 DIAGNOSIS — I10 Essential (primary) hypertension: Secondary | ICD-10-CM

## 2019-04-08 DIAGNOSIS — L723 Sebaceous cyst: Secondary | ICD-10-CM

## 2019-04-08 DIAGNOSIS — Z23 Encounter for immunization: Secondary | ICD-10-CM | POA: Diagnosis not present

## 2019-04-08 DIAGNOSIS — E538 Deficiency of other specified B group vitamins: Secondary | ICD-10-CM

## 2019-04-08 DIAGNOSIS — L989 Disorder of the skin and subcutaneous tissue, unspecified: Secondary | ICD-10-CM

## 2019-04-08 DIAGNOSIS — L089 Local infection of the skin and subcutaneous tissue, unspecified: Secondary | ICD-10-CM

## 2019-04-08 DIAGNOSIS — G4700 Insomnia, unspecified: Secondary | ICD-10-CM

## 2019-04-08 DIAGNOSIS — E559 Vitamin D deficiency, unspecified: Secondary | ICD-10-CM

## 2019-04-08 MED ORDER — METOPROLOL TARTRATE 50 MG PO TABS: 50.0000 mg | ORAL_TABLET | Freq: Two times a day (BID) | ORAL | 3 refills | Status: DC

## 2019-04-08 MED ORDER — CYANOCOBALAMIN 1000 MCG/ML IJ SOLN
1000.0000 ug | Freq: Once | INTRAMUSCULAR | Status: AC
  Administered 2019-04-08: 1000 ug via INTRAMUSCULAR

## 2019-04-08 MED ORDER — DOXYCYCLINE HYCLATE 100 MG PO TABS: 100.0000 mg | ORAL_TABLET | Freq: Two times a day (BID) | ORAL | 0 refills | Status: DC

## 2019-04-08 MED ORDER — VITAMIN B-12 1000 MCG PO TABS: 1000.0000 ug | ORAL_TABLET | Freq: Every day | ORAL | 3 refills | Status: DC

## 2019-04-08 MED ORDER — ZOLPIDEM TARTRATE 10 MG PO TABS: 10.0000 mg | ORAL_TABLET | Freq: Every evening | ORAL | 5 refills | Status: DC | PRN

## 2019-04-08 NOTE — Patient Instructions (Addendum)
You had the flu shot today  You had the B12 shot today  Please take OTC Vitamin D3 at 2000 units per day, indefinitely, and the Vitamin B12 pills daily as well  Please take all new medication as prescribed - the ambien for sleep, and the antibiotic  You will be contacted regarding the referral for: General Surgury, and Dermatology  Please continue all other medications as before, and refills have been done if requested - the metoprolol  Please have the pharmacy call with any other refills you may need.  Please continue your efforts at being more active, low cholesterol diet, and weight control..  Please keep your appointments with your specialists as you may have planned  Please return in 6 months, or sooner if needed

## 2019-04-11 ENCOUNTER — Encounter: Payer: Self-pay | Admitting: Internal Medicine

## 2019-04-11 DIAGNOSIS — G4700 Insomnia, unspecified: Secondary | ICD-10-CM | POA: Insufficient documentation

## 2019-04-11 DIAGNOSIS — E538 Deficiency of other specified B group vitamins: Secondary | ICD-10-CM | POA: Insufficient documentation

## 2019-04-11 DIAGNOSIS — E559 Vitamin D deficiency, unspecified: Secondary | ICD-10-CM | POA: Insufficient documentation

## 2019-04-11 DIAGNOSIS — L989 Disorder of the skin and subcutaneous tissue, unspecified: Secondary | ICD-10-CM | POA: Insufficient documentation

## 2019-04-11 DIAGNOSIS — L089 Local infection of the skin and subcutaneous tissue, unspecified: Secondary | ICD-10-CM | POA: Insufficient documentation

## 2019-04-11 NOTE — Assessment & Plan Note (Signed)
To restart the BB, check BP at home daily for 2 wks and call with results

## 2019-04-11 NOTE — Progress Notes (Signed)
Subjective:    Patient ID: [name], female    DOB: 1960/05/22, 59 y.o.   MRN: [id number]  HPI  Here to f/u; overall doing ok,  Pt denies chest pain, increasing sob or doe, wheezing, orthopnea, PND, increased LE swelling, palpitations, dizziness or syncope.  Pt denies new neurological symptoms such as new headache, or facial or extremity weakness or numbness.  Pt denies polydipsia, polyuria, or low sugar episode.  Pt states overall good compliance with meds, mostly trying to follow appropriate diet, with wt overall stable, Pt continues to have recurring LBP without change in severity, bowel or bladder change, fever, wt loss,  worsening LE pain/numbness/weakness, gait change or falls.  Denies worsening depressive symptoms, suicidal ideation, or panic; has ongoing anxiety.  Has been out of her BB since feb 2020,, not clear why, just forgot she was supposed to take it,, now with increased anxiety and BP at home 180/114 yesterday.  Also has a cystic lesion it seems that because sore then had some d/c, but now healing and trying to get larger again and sore  Due for flu shot.  Finished her high dose Vit D.  Has low B12  Also melatonin no longer working, just cant get to sleep most nights, lies awake, mind wont shut off.Also has a left upper back lesion that seems darker maybe larger as well. Past Medical History:  Diagnosis Date  . ANXIETY   . ASTHMA   . BACK PAIN    Recurrent  . DEPRESSION   . Headache(784.0)    Migrane  . HYPERLIPIDEMIA   . Hypertension   . Hypothyroidism 02/26/2018  . Mitral valve prolapse   . PAIN, CHRONIC NEC   . Personal history of colonic adenomas 09/02/2012  . SYMPTOM, PALPITATIONS    Past Surgical History:  Procedure Laterality Date  . ABDOMINAL HYSTERECTOMY     1997  . APPENDECTOMY    . CERVICAL FUSION  2003  . CESAREAN SECTION     x2  . FOOT SURGERY     Left foot  . KNEE SURGERY  2000   Right Knee-Cartilage Problem  . OOPHORECTOMY  1996/2000  .  TONSILLECTOMY      reports that she has been smoking cigarettes. She has a 12.50 pack-year smoking history. She has never used smokeless tobacco. She reports that she does not drink alcohol or use drugs. family history includes Arthritis in her father; COPD in her sister; Cancer in her brother, maternal grandmother, and another family member; Colon cancer (age of onset: 64) in her maternal uncle; Colon cancer (age of onset: 25) in her maternal uncle; Diabetes in her father, sister, and sister; Heart disease in her mother and sister. Allergies  Allergen Reactions  . Aspirin Hives and Shortness Of Breath  . Sulfonamide Derivatives Swelling   Current Outpatient Medications on File Prior to Visit  Medication Sig Dispense Refill  . albuterol (VENTOLIN HFA) 108 (90 BASE) MCG/ACT inhaler Inhale 2 puffs into the lungs every 6 (six) hours as needed for wheezing. 8.5 Inhaler 6  . amitriptyline (ELAVIL) 100 MG tablet Take 1 tablet by mouth at bedtime.    . chlorhexidine (PERIDEX) 0.12 % solution Use as directed 15 mLs in the mouth or throat 2 (two) times daily. 120 mL 0  . clonazePAM (KLONOPIN) 0.5 MG tablet Take 1 tablet (0.5 mg total) by mouth 2 (two) times daily as needed for anxiety. 60 tablet 2  . cyclobenzaprine (FLEXERIL) 10 MG tablet Take 10 mg  by mouth 3 (three) times daily as needed.    . cyclobenzaprine (FLEXERIL) 5 MG tablet Take 1 tablet (5 mg total) by mouth 3 (three) times daily as needed for muscle spasms. 60 tablet 2  . DULoxetine (CYMBALTA) 60 MG capsule TAKE 1 CAPSULE (60 MG TOTAL) BY MOUTH twice daily 180 capsule 3  . gabapentin (NEURONTIN) 300 MG capsule Take 1 capsule by mouth 3 (three) times daily.    . hydrochlorothiazide (MICROZIDE) 12.5 MG capsule Take 1 capsule (12.5 mg total) by mouth daily. 90 capsule 3  . HYDROcodone-acetaminophen (NORCO) 10-325 MG tablet Take 1 tablet by mouth every 6 (six) hours as needed.    Marland Kitchen HYDROcodone-acetaminophen (NORCO) 7.5-325 MG tablet as needed.     Marland Kitchen levothyroxine (SYNTHROID, LEVOTHROID) 75 MCG tablet Take 1 tablet (75 mcg total) by mouth daily. 90 tablet 3  . meloxicam (MOBIC) 7.5 MG tablet Take 7.5 mg by mouth 2 (two) times daily.    . ondansetron (ZOFRAN) 4 MG tablet Take 1 tablet (4 mg total) by mouth every 8 (eight) hours as needed for nausea or vomiting. 20 tablet 0  . triamcinolone (NASACORT) 55 MCG/ACT AERO nasal inhaler Place 2 sprays into the nose daily. 1 Inhaler 12  . Vitamin D, Ergocalciferol, (DRISDOL) 1.25 MG (50000 UT) CAPS capsule Take 1 capsule (50,000 Units total) by mouth every 7 (seven) days. 12 capsule 0  . atorvastatin (LIPITOR) 20 MG tablet Take 1 tablet (20 mg total) by mouth daily. 90 tablet 3  . butalbital-acetaminophen-caffeine (FIORICET, ESGIC) 50-325-40 MG per tablet Take 1 tablet by mouth every 6 (six) hours as needed. For migraines (Patient not taking: Reported on 04/08/2019) 20 tablet 1  . cetirizine (ZYRTEC) 10 MG tablet Take 1 tablet (10 mg total) by mouth daily. 30 tablet 11   No current facility-administered medications on file prior to visit.   Review of Systems  Constitutional: Negative for other unusual diaphoresis or sweats HENT: Negative for ear discharge or swelling Eyes: Negative for other worsening visual disturbances Respiratory: Negative for stridor or other swelling  Gastrointestinal: Negative for worsening distension or other blood Genitourinary: Negative for retention or other urinary change Musculoskeletal: Negative for other MSK pain or swelling Skin: Negative for color change or other new lesions Neurological: Negative for worsening tremors and other numbness  Psychiatric/Behavioral: Negative for worsening agitation or other fatigue All otherwise neg per pt     Objective:   Physical Exam BP (!) 180/104 (BP Location: Left Arm, Patient Position: Sitting, Cuff Size: Large)   Pulse 95   Temp 98.4 F (36.9 C) (Oral)   Ht 5\' 10"  (1.778 m)   Wt 280 lb (127 kg)   SpO2 97%   BMI  40.18 kg/m  VS noted,  Constitutional: Pt appears in NAD HENT: Head: NCAT.  Right Ear: External ear normal.  Left Ear: External ear normal.  Eyes: . Pupils are equal, round, and reactive to light. Conjunctivae and EOM are normal Nose: without d/c or deformity Neck: Neck supple. Gross normal ROM Cardiovascular: Normal rate and regular rhythm.   Pulmonary/Chest: Effort normal and breath sounds without rales or wheezing.  Abd:  Soft, NT, ND, + BS, no organomegaly Neurological: Pt is alert. At baseline orientation, motor grossly intact Skin: Skin is warm. + lesion to left upper back > 1/2 cm dark raised with somewhat irreg edges, no LE edema. + lower back seb cyst tender inflamed Psychiatric: Pt behavior is normal without agitation  All otherwise neg per pt  Lab  Results  Component Value Date   WBC 9.1 08/31/2018   HGB 15.1 (H) 08/31/2018   HCT 44.2 08/31/2018   PLT 198.0 08/31/2018   GLUCOSE 112 (H) 08/31/2018   CHOL 232 (H) 08/31/2018   TRIG 213.0 (H) 08/31/2018   HDL 40.40 08/31/2018   LDLDIRECT 161.0 08/31/2018   LDLCALC 118 (H) 08/12/2008   ALT 25 08/31/2018   AST 19 08/31/2018   NA 139 08/31/2018   K 4.1 08/31/2018   CL 102 08/31/2018   CREATININE 0.98 08/31/2018   BUN 14 08/31/2018   CO2 29 08/31/2018   TSH 5.37 (H) 08/31/2018   HGBA1C 5.6 08/31/2018        Assessment & Plan:

## 2019-04-11 NOTE — Assessment & Plan Note (Signed)
For derm referral f/o melanoma

## 2019-04-11 NOTE — Assessment & Plan Note (Signed)
Ok for ambien qhs prn 

## 2019-04-11 NOTE — Assessment & Plan Note (Signed)
Cont klononpin

## 2019-04-11 NOTE — Assessment & Plan Note (Signed)
For b12 1000 mg IM

## 2019-04-11 NOTE — Assessment & Plan Note (Signed)
Mild to mod, for antibx course,  Refer general surgury,  to f/u any worsening symptoms or concerns

## 2019-04-11 NOTE — Assessment & Plan Note (Signed)
For oral Vit D3 2000

## 2019-04-23 DIAGNOSIS — G894 Chronic pain syndrome: Secondary | ICD-10-CM | POA: Diagnosis not present

## 2019-04-23 DIAGNOSIS — M5106 Intervertebral disc disorders with myelopathy, lumbar region: Secondary | ICD-10-CM | POA: Diagnosis not present

## 2019-04-23 DIAGNOSIS — M5416 Radiculopathy, lumbar region: Secondary | ICD-10-CM | POA: Diagnosis not present

## 2019-04-27 ENCOUNTER — Other Ambulatory Visit: Payer: Self-pay | Admitting: Internal Medicine

## 2019-05-03 DIAGNOSIS — L723 Sebaceous cyst: Secondary | ICD-10-CM | POA: Diagnosis not present

## 2019-05-07 ENCOUNTER — Encounter: Payer: Self-pay | Admitting: Internal Medicine

## 2019-06-23 DIAGNOSIS — M5416 Radiculopathy, lumbar region: Secondary | ICD-10-CM | POA: Diagnosis not present

## 2019-06-23 DIAGNOSIS — Z6839 Body mass index (BMI) 39.0-39.9, adult: Secondary | ICD-10-CM | POA: Diagnosis not present

## 2019-06-23 DIAGNOSIS — Z79899 Other long term (current) drug therapy: Secondary | ICD-10-CM | POA: Diagnosis not present

## 2019-06-23 DIAGNOSIS — G894 Chronic pain syndrome: Secondary | ICD-10-CM | POA: Diagnosis not present

## 2019-06-23 DIAGNOSIS — M5106 Intervertebral disc disorders with myelopathy, lumbar region: Secondary | ICD-10-CM | POA: Diagnosis not present

## 2019-09-13 DIAGNOSIS — G894 Chronic pain syndrome: Secondary | ICD-10-CM | POA: Diagnosis not present

## 2019-09-13 DIAGNOSIS — M7062 Trochanteric bursitis, left hip: Secondary | ICD-10-CM | POA: Diagnosis not present

## 2019-09-13 DIAGNOSIS — M5416 Radiculopathy, lumbar region: Secondary | ICD-10-CM | POA: Diagnosis not present

## 2019-09-13 DIAGNOSIS — M542 Cervicalgia: Secondary | ICD-10-CM | POA: Diagnosis not present

## 2019-09-13 DIAGNOSIS — M5106 Intervertebral disc disorders with myelopathy, lumbar region: Secondary | ICD-10-CM | POA: Diagnosis not present

## 2019-11-07 ENCOUNTER — Other Ambulatory Visit: Payer: Self-pay | Admitting: Internal Medicine

## 2019-11-07 NOTE — Telephone Encounter (Signed)
Please refill as per office routine med refill policy (all routine meds refilled for 3 mo or monthly per pt preference up to one year from last visit, then month to month grace period for 3 mo, then further med refills will have to be denied)  

## 2019-11-15 DIAGNOSIS — Z6839 Body mass index (BMI) 39.0-39.9, adult: Secondary | ICD-10-CM | POA: Diagnosis not present

## 2019-11-15 DIAGNOSIS — M5106 Intervertebral disc disorders with myelopathy, lumbar region: Secondary | ICD-10-CM | POA: Diagnosis not present

## 2019-11-15 DIAGNOSIS — G894 Chronic pain syndrome: Secondary | ICD-10-CM | POA: Diagnosis not present

## 2019-11-15 DIAGNOSIS — M5416 Radiculopathy, lumbar region: Secondary | ICD-10-CM | POA: Diagnosis not present

## 2020-01-20 DIAGNOSIS — Z6839 Body mass index (BMI) 39.0-39.9, adult: Secondary | ICD-10-CM | POA: Diagnosis not present

## 2020-01-20 DIAGNOSIS — Z79899 Other long term (current) drug therapy: Secondary | ICD-10-CM | POA: Diagnosis not present

## 2020-01-20 DIAGNOSIS — M5416 Radiculopathy, lumbar region: Secondary | ICD-10-CM | POA: Diagnosis not present

## 2020-01-20 DIAGNOSIS — G894 Chronic pain syndrome: Secondary | ICD-10-CM | POA: Diagnosis not present

## 2020-01-20 DIAGNOSIS — M5106 Intervertebral disc disorders with myelopathy, lumbar region: Secondary | ICD-10-CM | POA: Diagnosis not present

## 2020-05-01 ENCOUNTER — Encounter: Payer: Self-pay | Admitting: Internal Medicine

## 2020-05-01 ENCOUNTER — Telehealth (INDEPENDENT_AMBULATORY_CARE_PROVIDER_SITE_OTHER): Payer: BC Managed Care – PPO | Admitting: Internal Medicine

## 2020-05-01 DIAGNOSIS — R739 Hyperglycemia, unspecified: Secondary | ICD-10-CM

## 2020-05-01 DIAGNOSIS — E039 Hypothyroidism, unspecified: Secondary | ICD-10-CM

## 2020-05-01 DIAGNOSIS — F411 Generalized anxiety disorder: Secondary | ICD-10-CM

## 2020-05-01 DIAGNOSIS — F32A Depression, unspecified: Secondary | ICD-10-CM

## 2020-05-01 MED ORDER — ALPRAZOLAM 1 MG PO TABS: ORAL_TABLET | ORAL | 0 refills | Status: DC

## 2020-05-01 MED ORDER — CITALOPRAM HYDROBROMIDE 20 MG PO TABS: 20.0000 mg | ORAL_TABLET | Freq: Every day | ORAL | 3 refills | Status: DC

## 2020-05-01 NOTE — Progress Notes (Signed)
Patient ID: [name], female   DOB: [birth year], 60 y.o.   MRN: [id number]  Virtual Visit via Video Note  I connected with [name] on 05/01/20 at  1:20 PM EST by a video enabled telemedicine application and verified that I am speaking with the correct person using two identifiers.  Location of all participants today Patient: at home with daughter present Provider: at office   I discussed the limitations of evaluation and management by telemedicine and the availability of in person appointments. The patient expressed understanding and agreed to proceed.  History of Present Illness: Here to f/u after husband passed this AM with cancer at hospice home peacefully; pt has been struggling for several months with antidepressant not working as well as hoped , but no SI or HI.  Also not taking klonopin as on med list as even the 0.5 mg dose would make her too sleepy.  Today had several panic attacks on the way home after husband died, and daughter is helping her call to see if meds can be adjusted.  Pt denies chest pain, increased sob or doe, wheezing, orthopnea, PND, increased LE swelling, palpitations, dizziness or syncope.   Pt denies polydipsia, polyuria, Denies hyper or hypo thyroid symptoms such as voice, skin or hair change. Past Medical History:  Diagnosis Date  . ANXIETY   . ASTHMA   . BACK PAIN    Recurrent  . DEPRESSION   . Headache(784.0)    Migrane  . HYPERLIPIDEMIA   . Hypertension   . Hypothyroidism 02/26/2018  . Mitral valve prolapse   . PAIN, CHRONIC NEC   . Personal history of colonic adenomas 09/02/2012  . SYMPTOM, PALPITATIONS    Past Surgical History:  Procedure Laterality Date  . ABDOMINAL HYSTERECTOMY     1997  . APPENDECTOMY    . CERVICAL FUSION  2003  . CESAREAN SECTION     x2  . FOOT SURGERY     Left foot  . KNEE SURGERY  2000   Right Knee-Cartilage Problem  . OOPHORECTOMY  1996/2000  . TONSILLECTOMY      reports that she has been smoking  cigarettes. She has a 12.50 pack-year smoking history. She has never used smokeless tobacco. She reports that she does not drink alcohol and does not use drugs. family history includes Arthritis in her father; COPD in her sister; Cancer in her brother, maternal grandmother, and another family member; Colon cancer (age of onset: 74) in her maternal uncle; Colon cancer (age of onset: 45) in her maternal uncle; Diabetes in her father, sister, and sister; Heart disease in her mother and sister. Allergies  Allergen Reactions  . Aspirin Hives and Shortness Of Breath  . Sulfonamide Derivatives Swelling   Current Outpatient Medications on File Prior to Visit  Medication Sig Dispense Refill  . albuterol (VENTOLIN HFA) 108 (90 BASE) MCG/ACT inhaler Inhale 2 puffs into the lungs every 6 (six) hours as needed for wheezing. 8.5 Inhaler 6  . amitriptyline (ELAVIL) 100 MG tablet Take 1 tablet by mouth at bedtime.    Marland Kitchen atorvastatin (LIPITOR) 20 MG tablet Take 1 tablet (20 mg total) by mouth daily. 90 tablet 3  . butalbital-acetaminophen-caffeine (FIORICET, ESGIC) 50-325-40 MG per tablet Take 1 tablet by mouth every 6 (six) hours as needed. For migraines (Patient not taking: Reported on 04/08/2019) 20 tablet 1  . cetirizine (ZYRTEC) 10 MG tablet Take 1 tablet (10 mg total) by mouth daily. 30 tablet 11  . chlorhexidine (PERIDEX)  0.12 % solution Use as directed 15 mLs in the mouth or throat 2 (two) times daily. 120 mL 0  . cyclobenzaprine (FLEXERIL) 10 MG tablet Take 10 mg by mouth 3 (three) times daily as needed.    . cyclobenzaprine (FLEXERIL) 5 MG tablet Take 1 tablet (5 mg total) by mouth 3 (three) times daily as needed for muscle spasms. 60 tablet 2  . gabapentin (NEURONTIN) 300 MG capsule Take 1 capsule by mouth 3 (three) times daily.    . hydrochlorothiazide (MICROZIDE) 12.5 MG capsule Take 1 capsule (12.5 mg total) by mouth daily. 90 capsule 3  . HYDROcodone-acetaminophen (NORCO) 10-325 MG tablet Take 1  tablet by mouth every 6 (six) hours as needed.    Marland Kitchen HYDROcodone-acetaminophen (NORCO) 7.5-325 MG tablet as needed.    Marland Kitchen levothyroxine (SYNTHROID, LEVOTHROID) 75 MCG tablet Take 1 tablet (75 mcg total) by mouth daily. 90 tablet 3  . meloxicam (MOBIC) 7.5 MG tablet Take 7.5 mg by mouth 2 (two) times daily.    . metoprolol tartrate (LOPRESSOR) 50 MG tablet Take 1 tablet (50 mg total) by mouth 2 (two) times daily. 180 tablet 3  . ondansetron (ZOFRAN) 4 MG tablet Take 1 tablet (4 mg total) by mouth every 8 (eight) hours as needed for nausea or vomiting. 20 tablet 0  . triamcinolone (NASACORT) 55 MCG/ACT AERO nasal inhaler Place 2 sprays into the nose daily. 1 Inhaler 12  . vitamin B-12 (CYANOCOBALAMIN) 1000 MCG tablet Take 1 tablet (1,000 mcg total) by mouth daily. 90 tablet 3  . Vitamin D, Ergocalciferol, (DRISDOL) 1.25 MG (50000 UT) CAPS capsule Take 1 capsule (50,000 Units total) by mouth every 7 (seven) days. 12 capsule 0  . zolpidem (AMBIEN) 10 MG tablet Take 1 tablet (10 mg total) by mouth at bedtime as needed for sleep. 30 tablet 5   No current facility-administered medications on file prior to visit.    Observations/Objective: Alert, NAD, appropriate mood and affect, resps normal, cn 2-12 intact, moves all 4s, no visible rash or swelling Lab Results  Component Value Date   WBC 9.1 08/31/2018   HGB 15.1 (H) 08/31/2018   HCT 44.2 08/31/2018   PLT 198.0 08/31/2018   GLUCOSE 112 (H) 08/31/2018   CHOL 232 (H) 08/31/2018   TRIG 213.0 (H) 08/31/2018   HDL 40.40 08/31/2018   LDLDIRECT 161.0 08/31/2018   LDLCALC 118 (H) 08/12/2008   ALT 25 08/31/2018   AST 19 08/31/2018   NA 139 08/31/2018   K 4.1 08/31/2018   CL 102 08/31/2018   CREATININE 0.98 08/31/2018   BUN 14 08/31/2018   CO2 29 08/31/2018   TSH 5.37 (H) 08/31/2018   HGBA1C 5.6 08/31/2018   Assessment and Plan: See notes  Follow Up Instructions: See notes   I discussed the assessment and treatment plan with the patient.  The patient was provided an opportunity to ask questions and all were answered. The patient agreed with the plan and demonstrated an understanding of the instructions.   The patient was advised to call back or seek an in-person evaluation if the symptoms worsen or if the condition fails to improve as anticipated.  Bayard Males, MD

## 2020-05-01 NOTE — Patient Instructions (Signed)
Ok to stop the cymbalta and klonopin  Please take all new medication as prescribed - the celexa 20 mg daily, and the xanax as needed  Please continue all other medications as before, and refills have been done if requested.  Please have the pharmacy call with any other refills you may need.  Please continue your efforts at being more active, low cholesterol diet, and weight control.  Please keep your appointments with your specialists as you may have planned

## 2020-05-01 NOTE — Assessment & Plan Note (Signed)
With panic today, ok for change klonopin to xanax bid prn, to f/u any worsening symptoms or concerns

## 2020-05-01 NOTE — Assessment & Plan Note (Deleted)
D/w pt and daughter, ok for change cymbata to celexa 20 mg per day, and declines psychiatry referral or counseling for now; pt is advised that hospice likely offers grief counseling and could be sought there  

## 2020-05-01 NOTE — Assessment & Plan Note (Signed)
Lab Results  Component Value Date   TSH 5.37 (H) 08/31/2018   Stable, pt to continue levothyroxine 75

## 2020-05-01 NOTE — Assessment & Plan Note (Addendum)
D/w pt and daughter, ok for change cymbata to celexa 20 mg per day, and declines psychiatry referral or counseling for now; pt is advised that hospice likely offers grief counseling and could be sought there

## 2020-05-01 NOTE — Assessment & Plan Note (Signed)
Lab Results  Component Value Date   HGBA1C 5.6 08/31/2018   Stable, pt to continue current medical treatment  - diet  Current Outpatient Medications (Endocrine & Metabolic):  .  levothyroxine (SYNTHROID, LEVOTHROID) 75 MCG tablet, Take 1 tablet (75 mcg total) by mouth daily.  Current Outpatient Medications (Cardiovascular):  .  atorvastatin (LIPITOR) 20 MG tablet, Take 1 tablet (20 mg total) by mouth daily. .  hydrochlorothiazide (MICROZIDE) 12.5 MG capsule, Take 1 capsule (12.5 mg total) by mouth daily. .  metoprolol tartrate (LOPRESSOR) 50 MG tablet, Take 1 tablet (50 mg total) by mouth 2 (two) times daily.  Current Outpatient Medications (Respiratory):  .  albuterol (VENTOLIN HFA) 108 (90 BASE) MCG/ACT inhaler, Inhale 2 puffs into the lungs every 6 (six) hours as needed for wheezing. .  cetirizine (ZYRTEC) 10 MG tablet, Take 1 tablet (10 mg total) by mouth daily. Marland Kitchen  triamcinolone (NASACORT) 55 MCG/ACT AERO nasal inhaler, Place 2 sprays into the nose daily.  Current Outpatient Medications (Analgesics):  .  butalbital-acetaminophen-caffeine (FIORICET, ESGIC) 50-325-40 MG per tablet, Take 1 tablet by mouth every 6 (six) hours as needed. For migraines (Patient not taking: Reported on 04/08/2019) .  HYDROcodone-acetaminophen (NORCO) 10-325 MG tablet, Take 1 tablet by mouth every 6 (six) hours as needed. Marland Kitchen  HYDROcodone-acetaminophen (NORCO) 7.5-325 MG tablet, as needed. .  meloxicam (MOBIC) 7.5 MG tablet, Take 7.5 mg by mouth 2 (two) times daily.  Current Outpatient Medications (Hematological):  .  vitamin B-12 (CYANOCOBALAMIN) 1000 MCG tablet, Take 1 tablet (1,000 mcg total) by mouth daily.  Current Outpatient Medications (Other):  Marland Kitchen  ALPRAZolam (XANAX) 1 MG tablet, 1/2 - 1 tab by mouth twice per day as needed for stress .  citalopram (CELEXA) 20 MG tablet, Take 1 tablet (20 mg total) by mouth daily. Marland Kitchen  amitriptyline (ELAVIL) 100 MG tablet, Take 1 tablet by mouth at bedtime. .   chlorhexidine (PERIDEX) 0.12 % solution, Use as directed 15 mLs in the mouth or throat 2 (two) times daily. .  cyclobenzaprine (FLEXERIL) 10 MG tablet, Take 10 mg by mouth 3 (three) times daily as needed. .  cyclobenzaprine (FLEXERIL) 5 MG tablet, Take 1 tablet (5 mg total) by mouth 3 (three) times daily as needed for muscle spasms. Marland Kitchen  gabapentin (NEURONTIN) 300 MG capsule, Take 1 capsule by mouth 3 (three) times daily. .  ondansetron (ZOFRAN) 4 MG tablet, Take 1 tablet (4 mg total) by mouth every 8 (eight) hours as needed for nausea or vomiting. .  Vitamin D, Ergocalciferol, (DRISDOL) 1.25 MG (50000 UT) CAPS capsule, Take 1 capsule (50,000 Units total) by mouth every 7 (seven) days. Marland Kitchen  zolpidem (AMBIEN) 10 MG tablet, Take 1 tablet (10 mg total) by mouth at bedtime as needed for sleep.

## 2020-05-09 ENCOUNTER — Telehealth (INDEPENDENT_AMBULATORY_CARE_PROVIDER_SITE_OTHER): Payer: BC Managed Care – PPO | Admitting: Family Medicine

## 2020-05-09 DIAGNOSIS — R059 Cough, unspecified: Secondary | ICD-10-CM | POA: Diagnosis not present

## 2020-05-09 DIAGNOSIS — R062 Wheezing: Secondary | ICD-10-CM

## 2020-05-09 DIAGNOSIS — R519 Headache, unspecified: Secondary | ICD-10-CM | POA: Diagnosis not present

## 2020-05-09 DIAGNOSIS — R030 Elevated blood-pressure reading, without diagnosis of hypertension: Secondary | ICD-10-CM

## 2020-05-09 DIAGNOSIS — R42 Dizziness and giddiness: Secondary | ICD-10-CM | POA: Diagnosis not present

## 2020-05-09 NOTE — Progress Notes (Signed)
Virtual Visit via Video Note  I connected with [name]  on 05/09/20 at  3:00 PM EST by a video enabled telemedicine application and verified that I am speaking with the correct person using two identifiers.  Location patient: home, [place] Location provider:work or home office Persons participating in the virtual visit: patient, provider  I discussed the limitations of evaluation and management by telemedicine and the availability of in person appointments. The patient expressed understanding and agreed to proceed.   HPI:  Acute telemedicine visit for HA and elevated BP: -Onset: 4 days ago -Symptoms include:blood pressure very elevated 190s-200s/100s per her report at home, bad ha, dizziness, cough, wheezy, sneezing, low grade temp -Denies: CP, SOB, NVD, inability to eat/drink/get out of bed -son-in-law had covid recently and she was around him -Pertinent past medical history: asthma - only uses inhaler when sick, rarely; HTN - reports , thyroid disease - but no lab check is some time in chart -husband passed earlier this month in hospice  ROS: See pertinent positives and negatives per HPI.  Past Medical History:  Diagnosis Date  . ANXIETY   . ASTHMA   . BACK PAIN    Recurrent  . DEPRESSION   . Headache(784.0)    Migrane  . HYPERLIPIDEMIA   . Hypertension   . Hypothyroidism 02/26/2018  . Mitral valve prolapse   . PAIN, CHRONIC NEC   . Personal history of colonic adenomas 09/02/2012  . SYMPTOM, PALPITATIONS     Past Surgical History:  Procedure Laterality Date  . ABDOMINAL HYSTERECTOMY     1997  . APPENDECTOMY    . CERVICAL FUSION  2003  . CESAREAN SECTION     x2  . FOOT SURGERY     Left foot  . KNEE SURGERY  2000   Right Knee-Cartilage Problem  . OOPHORECTOMY  1996/2000  . TONSILLECTOMY       Current Outpatient Medications:  .  albuterol (VENTOLIN HFA) 108 (90 BASE) MCG/ACT inhaler, Inhale 2 puffs into the lungs every 6 (six) hours as needed for wheezing., Disp: 8.5  Inhaler, Rfl: 6 .  ALPRAZolam (XANAX) 1 MG tablet, 1/2 - 1 tab by mouth twice per day as needed for stress, Disp: 60 tablet, Rfl: 0 .  amitriptyline (ELAVIL) 100 MG tablet, Take 1 tablet by mouth at bedtime., Disp: , Rfl:  .  atorvastatin (LIPITOR) 20 MG tablet, Take 1 tablet (20 mg total) by mouth daily., Disp: 90 tablet, Rfl: 3 .  butalbital-acetaminophen-caffeine (FIORICET, ESGIC) 50-325-40 MG per tablet, Take 1 tablet by mouth every 6 (six) hours as needed. For migraines (Patient not taking: Reported on 04/08/2019), Disp: 20 tablet, Rfl: 1 .  cetirizine (ZYRTEC) 10 MG tablet, Take 1 tablet (10 mg total) by mouth daily., Disp: 30 tablet, Rfl: 11 .  chlorhexidine (PERIDEX) 0.12 % solution, Use as directed 15 mLs in the mouth or throat 2 (two) times daily., Disp: 120 mL, Rfl: 0 .  citalopram (CELEXA) 20 MG tablet, Take 1 tablet (20 mg total) by mouth daily., Disp: 90 tablet, Rfl: 3 .  cyclobenzaprine (FLEXERIL) 10 MG tablet, Take 10 mg by mouth 3 (three) times daily as needed., Disp: , Rfl:  .  cyclobenzaprine (FLEXERIL) 5 MG tablet, Take 1 tablet (5 mg total) by mouth 3 (three) times daily as needed for muscle spasms., Disp: 60 tablet, Rfl: 2 .  gabapentin (NEURONTIN) 300 MG capsule, Take 1 capsule by mouth 3 (three) times daily., Disp: , Rfl:  .  hydrochlorothiazide (MICROZIDE) 12.5  MG capsule, Take 1 capsule (12.5 mg total) by mouth daily., Disp: 90 capsule, Rfl: 3 .  HYDROcodone-acetaminophen (NORCO) 10-325 MG tablet, Take 1 tablet by mouth every 6 (six) hours as needed., Disp: , Rfl:  .  HYDROcodone-acetaminophen (NORCO) 7.5-325 MG tablet, as needed., Disp: , Rfl:  .  levothyroxine (SYNTHROID, LEVOTHROID) 75 MCG tablet, Take 1 tablet (75 mcg total) by mouth daily., Disp: 90 tablet, Rfl: 3 .  meloxicam (MOBIC) 7.5 MG tablet, Take 7.5 mg by mouth 2 (two) times daily., Disp: , Rfl:  .  metoprolol tartrate (LOPRESSOR) 50 MG tablet, Take 1 tablet (50 mg total) by mouth 2 (two) times daily., Disp: 180  tablet, Rfl: 3 .  ondansetron (ZOFRAN) 4 MG tablet, Take 1 tablet (4 mg total) by mouth every 8 (eight) hours as needed for nausea or vomiting., Disp: 20 tablet, Rfl: 0 .  triamcinolone (NASACORT) 55 MCG/ACT AERO nasal inhaler, Place 2 sprays into the nose daily., Disp: 1 Inhaler, Rfl: 12 .  vitamin B-12 (CYANOCOBALAMIN) 1000 MCG tablet, Take 1 tablet (1,000 mcg total) by mouth daily., Disp: 90 tablet, Rfl: 3 .  Vitamin D, Ergocalciferol, (DRISDOL) 1.25 MG (50000 UT) CAPS capsule, Take 1 capsule (50,000 Units total) by mouth every 7 (seven) days., Disp: 12 capsule, Rfl: 0 .  zolpidem (AMBIEN) 10 MG tablet, Take 1 tablet (10 mg total) by mouth at bedtime as needed for sleep., Disp: 30 tablet, Rfl: 5  EXAM:  VITALS per patient if applicable:  GENERAL: alert, oriented, appears well and in no acute distress  HEENT: atraumatic, conjunttiva clear, no obvious abnormalities on inspection of external nose and ears  NECK: normal movements of the head and neck  LUNGS: on inspection no signs of respiratory distress, breathing rate appears normal, no obvious gross SOB, gasping or wheezing  CV: no obvious cyanosis  MS: moves all visible extremities without noticeable abnormality  PSYCH/NEURO: pleasant and cooperative, no obvious depression or anxiety, speech and thought processing grossly intact  ASSESSMENT AND PLAN:  Discussed the following assessment and plan:  Elevated BP without diagnosis of hypertension  Dizziness  Nonintractable headache, unspecified chronicity pattern, unspecified headache type  Cough  Wheeze  -we discussed possible serious and likely etiologies for her multiple symptoms, options for evaluation and workup, limitations of telemedicine visit vs in person visit, treatment, treatment risks and precautions.  Advise given the degree of elevation in her blood pressure, the dizziness and headaches, that she needs prompt in-person evaluation at a higher level of care.  She  reports her primary care office was not able to see her today.  Advised of options for in person care and she prefers to go to a local urgent care.  She is still deciding which one she will go to.  She declines transportation, as reports she can have a friend take her.  There is also a concern for Covid given her other symptoms and a possible exposure.  Advised to go right away.  She agrees to do so.  Advised after urgent care evaluation, pending diagnosis and plan, to schedule follow-up with her primary care doctor.  Did let this patient know that I only do telemedicine on Tuesdays and Thursdays for [place]. Advised to schedule follow up visit with PCP or [facility] if any further questions or concerns to avoid delays in care.   I discussed the assessment and treatment plan with the patient. The patient was provided an opportunity to ask questions and all were answered. The patient agreed with  the plan and demonstrated an understanding of the instructions.     Lucretia Kern, DO

## 2020-05-09 NOTE — Patient Instructions (Addendum)
Please seek in person care right away for your symptoms.  Please ensure you have somebody to drive you, or call [phone] if you are unable to find someone to drive you.   I hope you are feeling better soon!   It was nice to meet you today. I help [facility] out with telemedicine visits on Tuesdays and Thursdays and am available for visits on those days. If you have any concerns or questions following this visit please schedule a follow up visit with your Primary Care doctor or seek care at a local urgent care clinic to avoid delays in care.

## 2020-05-11 ENCOUNTER — Other Ambulatory Visit: Payer: Self-pay | Admitting: Internal Medicine

## 2020-06-08 ENCOUNTER — Telehealth: Payer: Self-pay | Admitting: Internal Medicine

## 2020-06-08 NOTE — Telephone Encounter (Signed)
Patients daughter calling, they are trying to get the patient social security under being a widow and they said she cannot get it under 60 unless she is fully disabled. Family is wondering if her medical record reflect that she is disabled or if there is anything they need to do before her appointment with the social security office on 04.04.22

## 2020-06-09 NOTE — Telephone Encounter (Signed)
Sorry, I cannot say medically that she is completely disabled

## 2020-06-09 NOTE — Telephone Encounter (Signed)
[facility] notified of denial of medically being disable

## 2020-06-12 DIAGNOSIS — M5106 Intervertebral disc disorders with myelopathy, lumbar region: Secondary | ICD-10-CM | POA: Diagnosis not present

## 2020-06-12 DIAGNOSIS — Z79899 Other long term (current) drug therapy: Secondary | ICD-10-CM | POA: Diagnosis not present

## 2020-06-12 DIAGNOSIS — M5416 Radiculopathy, lumbar region: Secondary | ICD-10-CM | POA: Diagnosis not present

## 2020-06-12 DIAGNOSIS — Z6839 Body mass index (BMI) 39.0-39.9, adult: Secondary | ICD-10-CM | POA: Diagnosis not present

## 2020-06-12 DIAGNOSIS — G894 Chronic pain syndrome: Secondary | ICD-10-CM | POA: Diagnosis not present

## 2020-07-21 ENCOUNTER — Other Ambulatory Visit: Payer: Self-pay | Admitting: Internal Medicine

## 2020-08-11 ENCOUNTER — Encounter: Payer: Self-pay | Admitting: Internal Medicine

## 2020-08-11 ENCOUNTER — Ambulatory Visit (INDEPENDENT_AMBULATORY_CARE_PROVIDER_SITE_OTHER): Payer: Self-pay | Admitting: Internal Medicine

## 2020-08-11 ENCOUNTER — Other Ambulatory Visit: Payer: Self-pay

## 2020-08-11 VITALS — BP 152/94 | HR 73 | Temp 98.0°F | Ht 70.0 in | Wt 292.0 lb

## 2020-08-11 DIAGNOSIS — F5101 Primary insomnia: Secondary | ICD-10-CM

## 2020-08-11 DIAGNOSIS — G43009 Migraine without aura, not intractable, without status migrainosus: Secondary | ICD-10-CM

## 2020-08-11 DIAGNOSIS — E039 Hypothyroidism, unspecified: Secondary | ICD-10-CM

## 2020-08-11 DIAGNOSIS — I872 Venous insufficiency (chronic) (peripheral): Secondary | ICD-10-CM

## 2020-08-11 DIAGNOSIS — I10 Essential (primary) hypertension: Secondary | ICD-10-CM

## 2020-08-11 MED ORDER — BUTALBITAL-APAP-CAFFEINE 50-325-40 MG PO TABS: 1.0000 | ORAL_TABLET | Freq: Four times a day (QID) | ORAL | 1 refills | Status: DC | PRN

## 2020-08-11 MED ORDER — ALPRAZOLAM 1 MG PO TABS: ORAL_TABLET | ORAL | 1 refills | Status: DC

## 2020-08-11 MED ORDER — MELOXICAM 7.5 MG PO TABS: 7.5000 mg | ORAL_TABLET | Freq: Two times a day (BID) | ORAL | 5 refills | Status: AC

## 2020-08-11 MED ORDER — ATORVASTATIN CALCIUM 20 MG PO TABS: 20.0000 mg | ORAL_TABLET | Freq: Every day | ORAL | 3 refills | Status: AC

## 2020-08-11 MED ORDER — AMITRIPTYLINE HCL 100 MG PO TABS: 100.0000 mg | ORAL_TABLET | Freq: Every day | ORAL | 1 refills | Status: AC

## 2020-08-11 MED ORDER — METOPROLOL TARTRATE 50 MG PO TABS: 50.0000 mg | ORAL_TABLET | Freq: Two times a day (BID) | ORAL | 3 refills | Status: DC

## 2020-08-11 MED ORDER — ALBUTEROL SULFATE HFA 108 (90 BASE) MCG/ACT IN AERS: 2.0000 | INHALATION_SPRAY | Freq: Four times a day (QID) | RESPIRATORY_TRACT | 11 refills | Status: DC | PRN

## 2020-08-11 MED ORDER — HYDROCHLOROTHIAZIDE 25 MG PO TABS: 25.0000 mg | ORAL_TABLET | Freq: Every day | ORAL | 3 refills | Status: AC

## 2020-08-11 MED ORDER — GABAPENTIN 300 MG PO CAPS: 1.0000 | ORAL_CAPSULE | Freq: Three times a day (TID) | ORAL | 1 refills | Status: AC

## 2020-08-11 MED ORDER — CYCLOBENZAPRINE HCL 10 MG PO TABS: 10.0000 mg | ORAL_TABLET | Freq: Three times a day (TID) | ORAL | 1 refills | Status: AC | PRN

## 2020-08-11 MED ORDER — LEVOTHYROXINE SODIUM 75 MCG PO TABS: 75.0000 ug | ORAL_TABLET | Freq: Every day | ORAL | 3 refills | Status: DC

## 2020-08-11 NOTE — Progress Notes (Signed)
Patient ID: [name], female   DOB: 10/03/1960, 60 y.o.   MRN: [id number]        Chief Complaint: f/u htn, leg swelling, sleep difficulty, and low thyorid       HPI:  [name] is a 60 y.o. female here with persistent elevated BP and leg swelling, but Pt denies chest pain, increased sob or doe, wheezing, orthopnea, PND, palpitations, dizziness or syncope.  Denies hyper or hypo thyroid symptoms such as voice, skin or hair change, but realizes now she is not taking her thyroid medication.  Asks to restart the elavil as well for sleep.  Denies worsening depressive symptoms, suicidal ideation, or panic       Wt Readings from Last 3 Encounters:  08/11/20 292 lb (132.5 kg)  04/08/19 280 lb (127 kg)  02/26/18 276 lb (125.2 kg)   BP Readings from Last 3 Encounters:  08/11/20 (!) 152/94  04/08/19 (!) 180/104  02/26/18 132/84         Past Medical History:  Diagnosis Date  . ANXIETY   . ASTHMA   . BACK PAIN    Recurrent  . DEPRESSION   . Headache(784.0)    Migrane  . HYPERLIPIDEMIA   . Hypertension   . Hypothyroidism 02/26/2018  . Mitral valve prolapse   . PAIN, CHRONIC NEC   . Personal history of colonic adenomas 09/02/2012  . SYMPTOM, PALPITATIONS    Past Surgical History:  Procedure Laterality Date  . ABDOMINAL HYSTERECTOMY     1997  . APPENDECTOMY    . CERVICAL FUSION  2003  . CESAREAN SECTION     x2  . FOOT SURGERY     Left foot  . KNEE SURGERY  2000   Right Knee-Cartilage Problem  . OOPHORECTOMY  1996/2000  . TONSILLECTOMY      reports that she has been smoking cigarettes. She has a 12.50 pack-year smoking history. She has never used smokeless tobacco. She reports that she does not drink alcohol and does not use drugs. family history includes Arthritis in her father; COPD in her sister; Cancer in her brother, maternal grandmother, and another family member; Colon cancer (age of onset: 16) in her maternal uncle; Colon cancer (age of onset: 28) in her  maternal uncle; Diabetes in her father, sister, and sister; Heart disease in her mother and sister. Allergies  Allergen Reactions  . Aspirin Hives and Shortness Of Breath  . Sulfonamide Derivatives Swelling   Current Outpatient Medications on File Prior to Visit  Medication Sig Dispense Refill  . citalopram (CELEXA) 20 MG tablet Take 1 tablet (20 mg total) by mouth daily. 90 tablet 3  . HYDROcodone-acetaminophen (NORCO) 10-325 MG tablet Take 1 tablet by mouth every 6 (six) hours as needed.    . ondansetron (ZOFRAN) 4 MG tablet Take 1 tablet (4 mg total) by mouth every 8 (eight) hours as needed for nausea or vomiting. 20 tablet 0  . triamcinolone (NASACORT) 55 MCG/ACT AERO nasal inhaler Place 2 sprays into the nose daily. 1 Inhaler 12  . vitamin B-12 (CYANOCOBALAMIN) 1000 MCG tablet Take 1 tablet (1,000 mcg total) by mouth daily. 90 tablet 3  . cetirizine (ZYRTEC) 10 MG tablet Take 1 tablet (10 mg total) by mouth daily. 30 tablet 11   No current facility-administered medications on file prior to visit.        ROS:  All others reviewed and negative.  Objective        PE:  BP Marland Kitchen)  152/94 (BP Location: Right Arm, Patient Position: Sitting, Cuff Size: Large)   Pulse 73   Temp 98 F (36.7 C) (Oral)   Ht 5\' 10"  (1.778 m)   Wt 292 lb (132.5 kg)   SpO2 97%   BMI 41.90 kg/m                 Constitutional: Pt appears in NAD               HENT: Head: NCAT.                Right Ear: External ear normal.                 Left Ear: External ear normal.                Eyes: . Pupils are equal, round, and reactive to light. Conjunctivae and EOM are normal               Nose: without d/c or deformity               Neck: Neck supple. Gross normal ROM               Cardiovascular: Normal rate and regular rhythm.                 Pulmonary/Chest: Effort normal and breath sounds without rales or wheezing.                Abd:  Soft, NT, ND, + BS, no organomegaly               Neurological: Pt is  alert. At baseline orientation, motor grossly intact               Skin: Skin is warm. No rashes, no other new lesions, LE edema - 1-2+ bilat leg edema               Psychiatric: Pt behavior is normal without agitation   Micro: none  Cardiac tracings I have personally interpreted today:  none  Pertinent Radiological findings (summarize): none   Lab Results  Component Value Date   WBC 9.1 08/31/2018   HGB 15.1 (H) 08/31/2018   HCT 44.2 08/31/2018   PLT 198.0 08/31/2018   GLUCOSE 112 (H) 08/31/2018   CHOL 232 (H) 08/31/2018   TRIG 213.0 (H) 08/31/2018   HDL 40.40 08/31/2018   LDLDIRECT 161.0 08/31/2018   LDLCALC 118 (H) 08/12/2008   ALT 25 08/31/2018   AST 19 08/31/2018   NA 139 08/31/2018   K 4.1 08/31/2018   CL 102 08/31/2018   CREATININE 0.98 08/31/2018   BUN 14 08/31/2018   CO2 29 08/31/2018   TSH 5.37 (H) 08/31/2018   HGBA1C 5.6 08/31/2018   Assessment/Plan:  [name] is a 60 y.o. White or Caucasian [1] female with  has a past medical history of ANXIETY, ASTHMA, BACK PAIN, DEPRESSION, Headache(784.0), HYPERLIPIDEMIA, Hypertension, Hypothyroidism (02/26/2018), Mitral valve prolapse, PAIN, CHRONIC NEC, Personal history of colonic adenomas (09/02/2012), and SYMPTOM, PALPITATIONS.  Venous insufficiency Ok for hctz 25 qd  Essential hypertension Ok for hctz 25 qd  Migraine without aura Cont current tx,  to f/u any worsening symptoms or concerns  Hypothyroidism Ok to restart meds, and f/u lab in 4 wks  Insomnia Ok for restart elavil qhs prn  Followup: Return in about 4 weeks (around 09/08/2020).  Cathlean Cower, MD 08/11/2020 9:50 PM [place]  [facility]

## 2020-08-11 NOTE — Assessment & Plan Note (Signed)
Ok for restart elavil qhs prn

## 2020-08-11 NOTE — Assessment & Plan Note (Signed)
Cont current tx,  to f/u any worsening symptoms or concerns

## 2020-08-11 NOTE — Assessment & Plan Note (Signed)
Ok for hctz 25 qd 

## 2020-08-11 NOTE — Assessment & Plan Note (Signed)
Ok for hctz 25 qd

## 2020-08-11 NOTE — Assessment & Plan Note (Signed)
Ok to restart meds, and f/u lab in 4 wks

## 2020-08-11 NOTE — Patient Instructions (Signed)
Ok to increase the HCTZ fluid pill to 25 mg per day to help with BP and swelling  Ok to restart the elavil at bedtime for sleep, and the thyroid medication  Please continue all other medications as before, and refills have been done if requested - see your med list  Please have the pharmacy call with any other refills you may need.  Please continue your efforts at being more active, low cholesterol diet, and weight control.  Please keep your appointments with your specialists as you may have planned  Please make an Appointment to return in 1 month to recheck

## 2021-04-20 ENCOUNTER — Other Ambulatory Visit: Payer: Self-pay | Admitting: Internal Medicine

## 2021-05-26 ENCOUNTER — Other Ambulatory Visit: Payer: Self-pay | Admitting: Internal Medicine

## 2021-05-27 NOTE — Telephone Encounter (Signed)
Please refill as per office routine med refill policy (all routine meds to be refilled for 3 mo or monthly (per pt preference) up to one year from last visit, then month to month grace period for 3 mo, then further med refills will have to be denied) ? ?

## 2021-08-23 ENCOUNTER — Other Ambulatory Visit: Payer: Self-pay | Admitting: Internal Medicine

## 2021-08-23 NOTE — Telephone Encounter (Signed)
Please refill as per office routine med refill policy (all routine meds to be refilled for 3 mo or monthly (per pt preference) up to one year from last visit, then month to month grace period for 3 mo, then further med refills will have to be denied) ? ?

## 2021-09-03 ENCOUNTER — Other Ambulatory Visit: Payer: Self-pay | Admitting: Internal Medicine

## 2021-09-03 NOTE — Telephone Encounter (Signed)
Please refill as per office routine med refill policy (all routine meds to be refilled for 3 mo or monthly (per pt preference) up to one year from last visit, then month to month grace period for 3 mo, then further med refills will have to be denied) ? ?

## 2021-09-09 ENCOUNTER — Other Ambulatory Visit: Payer: Self-pay | Admitting: Internal Medicine

## 2021-09-22 ENCOUNTER — Other Ambulatory Visit: Payer: Self-pay | Admitting: Internal Medicine

## 2021-09-22 NOTE — Telephone Encounter (Signed)
Please refill as per office routine med refill policy (all routine meds to be refilled for 3 mo or monthly (per pt preference) up to one year from last visit, then month to month grace period for 3 mo, then further med refills will have to be denied) ? ?

## 2021-09-23 ENCOUNTER — Other Ambulatory Visit: Payer: Self-pay | Admitting: Internal Medicine

## 2021-09-23 NOTE — Telephone Encounter (Signed)
Please refill as per office routine med refill policy (all routine meds to be refilled for 3 mo or monthly (per pt preference) up to one year from last visit, then month to month grace period for 3 mo, then further med refills will have to be denied) ? ?

## 2021-09-26 MED ORDER — METOPROLOL TARTRATE 50 MG PO TABS: 50.0000 mg | ORAL_TABLET | Freq: Two times a day (BID) | ORAL | 0 refills | Status: DC

## 2021-09-26 NOTE — Addendum Note (Signed)
Addended by: Biagio Borg on: 09/26/2021 10:29 AM   Modules accepted: Orders

## 2022-02-18 ENCOUNTER — Other Ambulatory Visit: Payer: Self-pay | Admitting: Internal Medicine

## 2022-04-23 ENCOUNTER — Emergency Department (HOSPITAL_COMMUNITY)
Admission: EM | Admit: 2022-04-23 | Discharge: 2022-04-24 | Disposition: A | Discharge status: Home / Self Care | Payer: Self-pay | Attending: Emergency Medicine | Admitting: Emergency Medicine

## 2022-04-23 ENCOUNTER — Emergency Department (HOSPITAL_COMMUNITY): Payer: Self-pay

## 2022-04-23 ENCOUNTER — Encounter (HOSPITAL_COMMUNITY): Payer: Self-pay

## 2022-04-23 ENCOUNTER — Other Ambulatory Visit: Payer: Self-pay

## 2022-04-23 DIAGNOSIS — J45909 Unspecified asthma, uncomplicated: Secondary | ICD-10-CM | POA: Insufficient documentation

## 2022-04-23 DIAGNOSIS — Z79890 Hormone replacement therapy (postmenopausal): Secondary | ICD-10-CM | POA: Insufficient documentation

## 2022-04-23 DIAGNOSIS — Z79899 Other long term (current) drug therapy: Secondary | ICD-10-CM | POA: Insufficient documentation

## 2022-04-23 DIAGNOSIS — E039 Hypothyroidism, unspecified: Secondary | ICD-10-CM | POA: Insufficient documentation

## 2022-04-23 DIAGNOSIS — R519 Headache, unspecified: Secondary | ICD-10-CM | POA: Insufficient documentation

## 2022-04-23 DIAGNOSIS — Z5329 Procedure and treatment not carried out because of patient's decision for other reasons: Secondary | ICD-10-CM | POA: Insufficient documentation

## 2022-04-23 DIAGNOSIS — I10 Essential (primary) hypertension: Secondary | ICD-10-CM | POA: Insufficient documentation

## 2022-04-23 LAB — CBC WITH DIFFERENTIAL/PLATELET
Abs Immature Granulocytes: 0.01 10*3/uL (ref 0.00–0.07)
Basophils Absolute: 0 10*3/uL (ref 0.0–0.1)
Basophils Relative: 1 %
Eosinophils Absolute: 0.2 10*3/uL (ref 0.0–0.5)
Eosinophils Relative: 3 %
HCT: 44.3 % (ref 36.0–46.0)
Hemoglobin: 14.2 g/dL (ref 12.0–15.0)
Immature Granulocytes: 0 %
Lymphocytes Relative: 44 %
Lymphs Abs: 2.5 10*3/uL (ref 0.7–4.0)
MCH: 31 pg (ref 26.0–34.0)
MCHC: 32.1 g/dL (ref 30.0–36.0)
MCV: 96.7 fL (ref 80.0–100.0)
Monocytes Absolute: 0.4 10*3/uL (ref 0.1–1.0)
Monocytes Relative: 7 %
Neutro Abs: 2.6 10*3/uL (ref 1.7–7.7)
Neutrophils Relative %: 45 %
Platelets: 174 10*3/uL (ref 150–400)
RBC: 4.58 MIL/uL (ref 3.87–5.11)
RDW: 12.7 % (ref 11.5–15.5)
WBC: 5.8 10*3/uL (ref 4.0–10.5)
nRBC: 0 % (ref 0.0–0.2)

## 2022-04-23 LAB — BASIC METABOLIC PANEL
Anion gap: 10 (ref 5–15)
BUN: 10 mg/dL (ref 6–20)
CO2: 27 mmol/L (ref 22–32)
Calcium: 8.7 mg/dL — ABNORMAL LOW (ref 8.9–10.3)
Chloride: 104 mmol/L (ref 98–111)
Creatinine, Ser: 1.01 mg/dL — ABNORMAL HIGH (ref 0.44–1.00)
GFR, Estimated: >60 mL/min (ref >60)
Glucose, Bld: 109 mg/dL — ABNORMAL HIGH (ref 70–99)
Potassium: 4 mmol/L (ref 3.5–5.1)
Sodium: 141 mmol/L (ref 135–145)

## 2022-04-23 LAB — MAGNESIUM: Magnesium: 1.9 mg/dL (ref 1.7–2.4)

## 2022-04-23 MED ORDER — MORPHINE SULFATE (PF) 4 MG/ML IV SOLN
4.0000 mg | Freq: Once | INTRAVENOUS | Status: AC
  Administered 2022-04-23: 4 mg via INTRAVENOUS
  Filled 2022-04-23: qty 1

## 2022-04-23 MED ORDER — PROCHLORPERAZINE EDISYLATE 10 MG/2ML IJ SOLN
10.0000 mg | Freq: Once | INTRAMUSCULAR | Status: AC
  Administered 2022-04-23: 10 mg via INTRAVENOUS
  Filled 2022-04-23: qty 2

## 2022-04-23 MED ORDER — HYDRALAZINE HCL 20 MG/ML IJ SOLN: 5.0000 mg | Freq: Once | INTRAMUSCULAR | Status: DC

## 2022-04-23 MED ORDER — DEXAMETHASONE SODIUM PHOSPHATE 10 MG/ML IJ SOLN
10.0000 mg | Freq: Once | INTRAMUSCULAR | Status: AC
  Administered 2022-04-23: 10 mg via INTRAVENOUS
  Filled 2022-04-23: qty 1

## 2022-04-23 NOTE — ED Triage Notes (Addendum)
Patient has had a migraine for [redacted] weeks along with high blood pressure. Said her eye sight has been blurry for 2 weeks even with her glasses. No vomiting. Hears a whoosh sound in her ears.

## 2022-04-23 NOTE — ED Provider Notes (Signed)
[facility] Provider Note   CSN: [id number] Arrival date & time: 04/23/22  1658     History {Add pertinent medical, surgical, social history, OB history to HPI:1} Chief Complaint  Patient presents with   Migraine   HPI [name] is a 62 y.o. female with poorly controlled HTN, upper lipidemia, hypothyroidism, mitral valve prolapse and asthma presenting for migraine and elevated blood pressure.  States her headaches been going on for 2 weeks.  States she has had a long history of migraines.  This is not the worst when she has had but the pain seems to be increasing in intensity.  Also now endorsing blurry vision in both eyes.  Denies chest pain shortness of breath.  Denies abdominal pain.  Denies urinary or bowel changes.  States she is not taking any blood pressure medications in a couple years since she lost medical insurance.  States her Medicare starts on Thursday and she will be able to resume taking her medications.  Also states that her balance has been off and her daughter mentioned that her spatial awareness has been noticeably reduced in the last couple weeks as well.   Migraine Associated symptoms include headaches.       Home Medications Prior to Admission medications   Medication Sig Start Date End Date Taking? Authorizing Provider  albuterol (VENTOLIN HFA) 108 (90 Base) MCG/ACT inhaler Inhale 2 puffs into the lungs every 6 (six) hours as needed for wheezing. 08/11/20   Biagio Borg, MD  ALPRAZolam Duanne Moron) 1 MG tablet 1/2 - 1 tab by mouth twice per day as needed for stress 08/11/20   Biagio Borg, MD  amitriptyline (ELAVIL) 100 MG tablet Take 1 tablet (100 mg total) by mouth at bedtime. 08/11/20   Biagio Borg, MD  atorvastatin (LIPITOR) 20 MG tablet Take 1 tablet (20 mg total) by mouth daily. 08/11/20 08/11/21  Biagio Borg, MD  butalbital-acetaminophen-caffeine (FIORICET) [phone] MG tablet Take 1 tablet by mouth every  6 (six) hours as needed. For migraines 08/11/20   Biagio Borg, MD  cetirizine (ZYRTEC) 10 MG tablet Take 1 tablet (10 mg total) by mouth daily. 02/26/18 02/26/19  Biagio Borg, MD  citalopram (CELEXA) 20 MG tablet TAKE 1 TABLET BY MOUTH EVERY DAY 04/20/21   Biagio Borg, MD  cyclobenzaprine (FLEXERIL) 10 MG tablet Take 1 tablet (10 mg total) by mouth 3 (three) times daily as needed. 08/11/20   Biagio Borg, MD  gabapentin (NEURONTIN) 300 MG capsule Take 1 capsule (300 mg total) by mouth 3 (three) times daily. 08/11/20   Biagio Borg, MD  hydrochlorothiazide (HYDRODIURIL) 25 MG tablet Take 1 tablet (25 mg total) by mouth daily. 08/11/20   Biagio Borg, MD  HYDROcodone-acetaminophen [facility]) 10-325 MG tablet Take 1 tablet by mouth every 6 (six) hours as needed. 02/27/19   [provider]  levothyroxine (SYNTHROID) 75 MCG tablet Take 1 tablet (75 mcg total) by mouth daily. Patient needs office visit. 08/23/21   Biagio Borg, MD  meloxicam (MOBIC) 7.5 MG tablet Take 1 tablet (7.5 mg total) by mouth 2 (two) times daily. 08/11/20   Biagio Borg, MD  metoprolol tartrate (LOPRESSOR) 50 MG tablet TAKE 1 TABLET (50 MG TOTAL) BY MOUTH 2 (TWO) TIMES DAILY. PATIENT NEEDS OFFICE VISIT. 09/09/21   Biagio Borg, MD  metoprolol tartrate (LOPRESSOR) 50 MG tablet Take 1 tablet (50 mg total) by mouth 2 (two) times  daily. 09/26/21   Biagio Borg, MD  ondansetron (ZOFRAN) 4 MG tablet Take 1 tablet (4 mg total) by mouth every 8 (eight) hours as needed for nausea or vomiting. 02/26/18   Biagio Borg, MD  triamcinolone (NASACORT) 55 MCG/ACT AERO nasal inhaler Place 2 sprays into the nose daily. 02/26/18   Biagio Borg, MD  vitamin B-12 (CYANOCOBALAMIN) 1000 MCG tablet Take 1 tablet (1,000 mcg total) by mouth daily. 04/08/19   Biagio Borg, MD      Allergies    Aspirin and Sulfonamide derivatives    Review of Systems   Review of Systems  Musculoskeletal:  Positive for gait problem.  Neurological:  Positive for  headaches.    Physical Exam   Vitals:   04/23/22 2100 04/23/22 2130  BP:  (!) 202/97  Pulse:  73  Resp:  18  Temp: 97.6 F (36.4 C)   SpO2:  100%    CONSTITUTIONAL:  well-appearing, NAD NEURO:  GCS 15. Speech is goal oriented. No deficits appreciated to CN III-XII; symmetric eyebrow raise, no facial drooping, tongue midline. Patient has equal grip strength bilaterally with 5/5 strength against resistance in all major muscle groups bilaterally. Sensation to light touch intact. Patient moves extremities without ataxia. Normal finger-nose-finger. Patient ambulatory with unsteady gait.  EYES:  eyes equal and reactive ENT/NECK:  Supple, no stridor CARDIO: regular rate and rhythm, appears well-perfused  PULM:  No respiratory distress, CTAB GI/GU:  non-distended, soft MSK/SPINE:  No gross deformities, no edema, moves all extremities  SKIN:  no rash, atraumatic   *Additional and/or pertinent findings included in MDM below    ED Results / Procedures / Treatments   Labs (all labs ordered are listed, but only abnormal results are displayed) Labs Reviewed  BASIC METABOLIC PANEL - Abnormal; Notable for the following components:      Result Value   Glucose, Bld 109 (*)    Creatinine, Ser 1.01 (*)    Calcium 8.7 (*)    All other components within normal limits  CBC WITH DIFFERENTIAL/PLATELET  MAGNESIUM    EKG None  Radiology CT Head Wo Contrast  Result Date: 04/23/2022 CLINICAL DATA:  Headache, increasing frequency or severity. Headache x2 weeks with blurry vision. EXAM: CT HEAD WITHOUT CONTRAST TECHNIQUE: Contiguous axial images were obtained from the base of the skull through the vertex without intravenous contrast. RADIATION DOSE REDUCTION: This exam was performed according to the departmental dose-optimization program which includes automated exposure control, adjustment of the mA and/or kV according to patient size and/or use of iterative reconstruction technique. COMPARISON:   Brain MRI 06/27/2006. FINDINGS: Brain: No acute hemorrhage, mass effect or midline shift. Gray-white differentiation is preserved. No hydrocephalus. No extra-axial collection. Basilar cisterns are patent. Vascular: No hyperdense vessel or unexpected calcification. Skull: No calvarial fracture or suspicious bone lesion. Skull base is unremarkable. Sinuses/Orbits: Paranasal sinuses, mastoid air cells, and middle ear cavities are well aerated. Orbits are unremarkable. Other: None. IMPRESSION: No acute intracranial abnormality. Electronically Signed   By: Emmit Alexanders M.D.   On: 04/23/2022 18:16    Procedures Procedures  {Document cardiac monitor, telemetry assessment procedure when appropriate:1}  Medications Ordered in ED Medications - No data to display  ED Course/ Medical Decision Making/ A&P   {   Click here for ABCD2, HEART and other calculatorsREFRESH Note before signing :1}  Medical Decision Making Risk Prescription drug management.   Initial Impression and Ddx 62 year old female who is well-appearing, hypertensive but otherwise hemodynamically stable presenting for headache and elevated blood pressure.  Unsteady gait noted on exam.  Differential diagnosis for this complaint includes central venous thrombosis, press, complicated migraine, and hypertensive emergency Patient PMH that increases complexity of ED encounter:  ***  Interpretation of Diagnostics I independent reviewed and interpreted the labs as followed: ***  - I independently visualized the following imaging with scope of interpretation limited to determining acute life threatening conditions related to emergency care: ***, which revealed ***  Patient Reassessment and Ultimate Disposition/Management ***  Patient management required discussion with the following services or consulting groups:  {BEROCONSULT:26841}  Complexity of Problems Addressed {BEROCOPA:26833}  Additional Data Reviewed  and Analyzed Further history obtained from: {BERODATA:26834}  Patient Encounter Risk Assessment {BERORISK:26838}   {Document critical care time when appropriate:1} {Document review of labs and clinical decision tools ie heart score, Chads2Vasc2 etc:1}  {Document your independent review of radiology images, and any outside records:1} {Document your discussion with family members, caretakers, and with consultants:1} {Document social determinants of health affecting pt's care:1} {Document your decision making why or why not admission, treatments were needed:1} Final Clinical Impression(s) / ED Diagnoses Final diagnoses:  None    Rx / DC Orders ED Discharge Orders     None

## 2022-04-23 NOTE — ED Provider Triage Note (Signed)
Emergency Medicine Provider Triage Evaluation Note  [name] , a 63 y.o. female  was evaluated in triage.  Pt complains of migraine headache for the past [redacted] weeks along with high blood pressure.  She sates her eye sight has been blurry for 2 weeks even when wearing glasses, she has had mild generalized weakness and dizziness.  She is not currently on medication for migraines or hypertension.  Hx significant for hyperlipidemia, hypertension, palpitations, MI.  Denies chest pain, shortness of breath, syncope.    Review of Systems  Positive: As above Negative: As above  Physical Exam  BP (!) 173/103 (BP Location: Right Arm)   Pulse 81   Temp (!) 97.4 F (36.3 C) (Oral)   Resp 18   Ht '5\' 11"'$  (1.803 m)   Wt 124.3 kg   SpO2 99%   BMI 38.22 kg/m  Gen:   Awake, no distress   Resp:  Normal effort  MSK:   Moves extremities without difficulty  Other:    Medical Decision Making  Medically screening exam initiated at 5:24 PM.  Appropriate orders placed.  [name] was informed that the remainder of the evaluation will be completed by another provider, this initial triage assessment does not replace that evaluation, and the importance of remaining in the ED until their evaluation is complete.     Theressa Stamps R, [place] 04/23/22 [phone]

## 2022-04-23 NOTE — ED Provider Notes (Signed)
  Physical Exam  BP (!) 202/97   Pulse 73   Temp 97.6 F (36.4 C)   Resp 18   Ht '5\' 11"'$  (1.803 m)   Wt 124.3 kg   SpO2 100%   BMI 38.22 kg/m   Physical Exam  Procedures  Procedures  ED Course / MDM    Medical Decision Making Amount and/or Complexity of Data Reviewed Radiology: ordered.  Risk Prescription drug management.   ***  HA with HTN, poorly controlled. 2 weeks of HA with blurry vision, ataxia, coordination issues. Migraine vs HTN emergency vs CVT. Getting an [place] of this patient assumed from preceding ED provider Joanette Gula, PA-C at time of shift change. Please see his associated note for further insight into the patient's ED course. In brief, 62 year old female with uncontrolled HTN and migraines who presents with concern for 2 weeks of HA with associated blurry vision, impaired balance, and spatial awareness per family.   Labs and CT head reassuring, but clinical concern for by preceding ED provider for cerebral venous sinus thrombus. Patient awaiting MR Venogram at this time. Permissive HTN until CVA / CVT ruled out.

## 2022-04-24 MED ORDER — AMLODIPINE BESYLATE 5 MG PO TABS: 5.0000 mg | ORAL_TABLET | Freq: Every day | ORAL | 0 refills | Status: AC

## 2022-04-24 NOTE — Discharge Instructions (Signed)
You were seen in the ER today for your headache.  You have chosen to leave the emergency [place] prior to completion of your workup with an MRI.  Please return as soon as you are able for completion of your workup and follow-up closely in outpatient setting with your primary care doctor.  In regards to your high blood pressure it is recommended that you resume blood pressure medication due to the severity of your high blood pressure.  You have been prescribed a medication which you should take once daily and may pick up from your pharmacy to manage your hypertension.  Please return to the emergency department develop any severe symptoms.

## 2022-04-26 ENCOUNTER — Encounter (HOSPITAL_COMMUNITY): Payer: Self-pay

## 2022-04-26 ENCOUNTER — Emergency Department (HOSPITAL_COMMUNITY): Payer: Medicare HMO

## 2022-04-26 ENCOUNTER — Other Ambulatory Visit: Payer: Self-pay

## 2022-04-26 ENCOUNTER — Emergency Department (HOSPITAL_COMMUNITY)
Admission: EM | Admit: 2022-04-26 | Discharge: 2022-04-26 | Disposition: A | Discharge status: Home / Self Care | Payer: Medicare HMO | Attending: Emergency Medicine | Admitting: Emergency Medicine

## 2022-04-26 DIAGNOSIS — I10 Essential (primary) hypertension: Secondary | ICD-10-CM | POA: Insufficient documentation

## 2022-04-26 DIAGNOSIS — G43809 Other migraine, not intractable, without status migrainosus: Secondary | ICD-10-CM | POA: Insufficient documentation

## 2022-04-26 DIAGNOSIS — Z79890 Hormone replacement therapy (postmenopausal): Secondary | ICD-10-CM | POA: Diagnosis not present

## 2022-04-26 DIAGNOSIS — R42 Dizziness and giddiness: Secondary | ICD-10-CM | POA: Diagnosis not present

## 2022-04-26 DIAGNOSIS — Z79899 Other long term (current) drug therapy: Secondary | ICD-10-CM | POA: Insufficient documentation

## 2022-04-26 DIAGNOSIS — R519 Headache, unspecified: Secondary | ICD-10-CM | POA: Diagnosis not present

## 2022-04-26 DIAGNOSIS — E039 Hypothyroidism, unspecified: Secondary | ICD-10-CM | POA: Diagnosis not present

## 2022-04-26 DIAGNOSIS — J45909 Unspecified asthma, uncomplicated: Secondary | ICD-10-CM | POA: Insufficient documentation

## 2022-04-26 LAB — CBC WITH DIFFERENTIAL/PLATELET
Abs Immature Granulocytes: 0.03 10*3/uL (ref 0.00–0.07)
Basophils Absolute: 0.1 10*3/uL (ref 0.0–0.1)
Basophils Relative: 1 %
Eosinophils Absolute: 0.1 10*3/uL (ref 0.0–0.5)
Eosinophils Relative: 1 %
HCT: 45.5 % (ref 36.0–46.0)
Hemoglobin: 14.8 g/dL (ref 12.0–15.0)
Immature Granulocytes: 0 %
Lymphocytes Relative: 50 %
Lymphs Abs: 4.8 10*3/uL — ABNORMAL HIGH (ref 0.7–4.0)
MCH: 31.4 pg (ref 26.0–34.0)
MCHC: 32.5 g/dL (ref 30.0–36.0)
MCV: 96.4 fL (ref 80.0–100.0)
Monocytes Absolute: 0.9 10*3/uL (ref 0.1–1.0)
Monocytes Relative: 9 %
Neutro Abs: 3.8 10*3/uL (ref 1.7–7.7)
Neutrophils Relative %: 39 %
Platelets: 194 10*3/uL (ref 150–400)
RBC: 4.72 MIL/uL (ref 3.87–5.11)
RDW: 12.7 % (ref 11.5–15.5)
WBC: 9.7 10*3/uL (ref 4.0–10.5)
nRBC: 0 % (ref 0.0–0.2)

## 2022-04-26 LAB — BASIC METABOLIC PANEL
Anion gap: 9 (ref 5–15)
BUN: 14 mg/dL (ref 6–20)
CO2: 27 mmol/L (ref 22–32)
Calcium: 8.9 mg/dL (ref 8.9–10.3)
Chloride: 103 mmol/L (ref 98–111)
Creatinine, Ser: 1.09 mg/dL — ABNORMAL HIGH (ref 0.44–1.00)
GFR, Estimated: 58 mL/min — ABNORMAL LOW (ref >60)
Glucose, Bld: 92 mg/dL (ref 70–99)
Potassium: 3.8 mmol/L (ref 3.5–5.1)
Sodium: 139 mmol/L (ref 135–145)

## 2022-04-26 MED ORDER — ACETAMINOPHEN 500 MG PO TABS
1000.0000 mg | ORAL_TABLET | Freq: Once | ORAL | Status: AC
  Administered 2022-04-26: 1000 mg via ORAL
  Filled 2022-04-26: qty 2

## 2022-04-26 MED ORDER — NAPROXEN 375 MG PO TABS: 375.0000 mg | ORAL_TABLET | Freq: Two times a day (BID) | ORAL | 0 refills | Status: AC

## 2022-04-26 MED ORDER — KETOROLAC TROMETHAMINE 15 MG/ML IJ SOLN
15.0000 mg | Freq: Once | INTRAMUSCULAR | Status: AC
  Administered 2022-04-26: 15 mg via INTRAVENOUS
  Filled 2022-04-26: qty 1

## 2022-04-26 MED ORDER — GADOBUTROL 1 MMOL/ML IV SOLN
10.0000 mL | Freq: Once | INTRAVENOUS | Status: AC | PRN
  Administered 2022-04-26: 10 mL via INTRAVENOUS

## 2022-04-26 MED ORDER — LACTATED RINGERS IV BOLUS
1000.0000 mL | Freq: Once | INTRAVENOUS | Status: AC
  Administered 2022-04-26: 1000 mL via INTRAVENOUS

## 2022-04-26 MED ORDER — DIPHENHYDRAMINE HCL 25 MG PO CAPS
25.0000 mg | ORAL_CAPSULE | Freq: Once | ORAL | Status: AC
  Administered 2022-04-26: 25 mg via ORAL
  Filled 2022-04-26: qty 1

## 2022-04-26 MED ORDER — LORAZEPAM 1 MG PO TABS
1.0000 mg | ORAL_TABLET | Freq: Once | ORAL | Status: AC
  Administered 2022-04-26: 1 mg via ORAL
  Filled 2022-04-26: qty 1

## 2022-04-26 MED ORDER — MAGNESIUM SULFATE 2 GM/50ML IV SOLN
2.0000 g | Freq: Once | INTRAVENOUS | Status: AC
  Administered 2022-04-26: 2 g via INTRAVENOUS
  Filled 2022-04-26: qty 50

## 2022-04-26 MED ORDER — PROCHLORPERAZINE EDISYLATE 10 MG/2ML IJ SOLN
10.0000 mg | Freq: Once | INTRAMUSCULAR | Status: AC
  Administered 2022-04-26: 10 mg via INTRAVENOUS
  Filled 2022-04-26: qty 2

## 2022-04-26 NOTE — ED Provider Triage Note (Signed)
Emergency Medicine Provider Triage Evaluation Note  [name] , a 62 y.o. female  was evaluated in triage.  Pt complains of headache, dizziness, blurred vision.  Patient reports symptoms for the past 2 and half weeks.  Was seen at [facility] on 1/30 where she left AGAINST MEDICAL ADVICE prior to obtaining MR venogram of head.  Contacted primary care today who told her to return to emergency department for imaging.  Patient states that headache has improved since prior discharge as well as feelings of dizziness.  Dizziness described as spinning sensation/feelings of off balance.  Still complaining of mild blurred vision bilaterally.  Denies fever, chest pain, shortness of breath, weakness/sensory deficits in upper or lower extremities, slurred speech, facial droop, gait abnormality outside of episodes of dizziness.  Review of Systems  Positive: See above Negative:  Physical Exam  BP (!) 191/93   Pulse 66   Temp (!) 97.5 F (36.4 C)   Resp 18   Ht '5\' 11"'$  (1.803 m)   Wt 124.3 kg   SpO2 99%   BMI 38.22 kg/m  Gen:   Awake, no distress   Resp:  Normal effort  MSK:   Moves extremities without difficulty  Other:  Cranial 3 through 12 grossly intact.  Patient ambulates in triage area without difficulty.  Medical Decision Making  Medically screening exam initiated at 11:32 AM.  Appropriate orders placed.  Laurieanne Galloway Fader was informed that the remainder of the evaluation will be completed by another provider, this initial triage assessment does not replace that evaluation, and the importance of remaining in the ED until their evaluation is complete.     Wilnette Kales, [place] 04/26/22 1135

## 2022-04-26 NOTE — ED Provider Notes (Signed)
  Physical Exam  BP (!) 162/111   Pulse 70   Temp 98 F (36.7 C)   Resp 18   Ht '5\' 11"'$  (1.803 m)   Wt 124.3 kg   SpO2 100%   BMI 38.22 kg/m   Physical Exam Vitals and nursing note reviewed.  Constitutional:      General: She is not in acute distress.    Appearance: She is well-developed.  HENT:     Head: Normocephalic and atraumatic.  Eyes:     Conjunctiva/sclera: Conjunctivae normal.  Cardiovascular:     Rate and Rhythm: Normal rate and regular rhythm.     Heart sounds: No murmur heard. Pulmonary:     Effort: Pulmonary effort is normal. No respiratory distress.     Breath sounds: Normal breath sounds.  Abdominal:     Palpations: Abdomen is soft.     Tenderness: There is no abdominal tenderness.  Musculoskeletal:        General: No swelling.     Cervical back: Neck supple.  Skin:    General: Skin is warm and dry.     Capillary Refill: Capillary refill takes less than 2 seconds.  Neurological:     Mental Status: She is alert.  Psychiatric:        Mood and Affect: Mood normal.     Procedures  Procedures  ED Course / MDM   Clinical Course as of 04/26/22 1844  Fri Apr 26, 2022  1257 BMP, CBC unremarkable [HN]  1411 MR Venogram Head IMPRESSION: Normal MRV head with no evidence of venous sinus stenosis or thrombosis.   [HN]  [id number] Patient requesting headache cocktail. Will give tylenol, toradol, LR, compazine, benadryl, and magnesium. [HN]  [id number] Discussed with radiology about MRV. No venous sinus stenosis and sella looks normal. No optic disc flattening, optic nerve sheath not particularly distended. Cannot strictly rule out.  [HN]    Clinical Course User Index [HN] Audley Hose, MD   Medical Decision Making Amount and/or Complexity of Data Reviewed Radiology: ordered. Decision-making details documented in ED Course.  Risk OTC drugs. Prescription drug management.   Patient received an handoff.  Persistent headache with arm numbness.  Pending  reevaluation after headache cocktail.  On reevaluation, symptoms completely resolved with headache cocktail.  Patient reportedly has had persistent migraine since she was 62 years old but has not had health insurance to be able to see a neurologist and follow this up.  She does drink an excessive amount of caffeine every day and states that this is the only thing that helps her headaches.  Unsure if patient is currently suffering from caffeine withdrawal headache versus true migraine but at this time, I have very low suspicion for acute stroke and we have shared decision-making and the discussion about advanced imaging and the patient and I both agree that her presentation does not fit with stroke and we will forego additional imaging at this time.  A referral was placed to [facility] neurology and patient was discharged with outpatient follow-up.  Patient states that she will create a headache log and decrease her caffeine intake to assist the neurology team.       Teressa Lower, MD 04/26/22 2257

## 2022-04-26 NOTE — ED Triage Notes (Addendum)
Pt presents with HA, elevated blood pressure, and being "off balanced." Pt was seen for same on 1/30 at [facility] and was Rx amlodipine. Pt's PCP wanted her to return to ED for an MRI.   Pt reports current HA, blurred vision, and when she stands she has some room spinning dizziness. Denies any CP, unilateral weakness. States symptoms are not as bad as when they first started.

## 2022-04-26 NOTE — ED Provider Notes (Signed)
[facility] Provider Note   CSN: [id number] Arrival date & time: 04/26/22  1050     History  No chief complaint on file.   [name] is a 62 y.o. female with h/o migraines presents with HA, dizziness, HTN.   Pt complains of headache, dizziness, blurred vision.  Patient reports symptoms for the past 2 and half weeks.  Was seen at [facility] on 1/30 where she left AMA prior to obtaining MR venogram of head.  Contacted primary care today who told her to return to emergency department for imaging.  Patient states that headache has improved since prior discharge as well as feelings of dizziness. Pt reports current right sided constant 8/10 throbbing HA, a/w blurred vision x 2 weeks. Endorses some dizziness and has "staggered" at home a couple of times. Intermittent numbness in her hands. Denies any LOC but has felt like she may lose consciousness at times.  and when she stands she has some room spinning dizziness. Denies any CP, unilateral weakness. Denies fever, chest pain, shortness of breath, asymmetric weakness, slurred speech, facial droop, gait abnormality outside of episodes of dizziness. She does endorse some arm numbness that comes and goes.      HPI     Home Medications Prior to Admission medications   Medication Sig Start Date End Date Taking? Authorizing Provider  amLODipine (NORVASC) 5 MG tablet Take 1 tablet (5 mg total) by mouth daily. 04/24/22 05/24/22 Yes Sponseller, Eugene Garnet R, PA-C  cyclobenzaprine (FLEXERIL) 10 MG tablet Take 1 tablet (10 mg total) by mouth 3 (three) times daily as needed. Patient taking differently: Take 10 mg by mouth See admin instructions. 10 mg by mouth every night + 10 mg during the day as needed for muscle spasms. 08/11/20  Yes Biagio Borg, MD  gabapentin (NEURONTIN) 300 MG capsule Take 1 capsule (300 mg total) by mouth 3 (three) times daily. Patient taking differently: Take 300-600 capsules by  mouth See admin instructions. 300 mg twice daily, 600 mg at bedtime. 08/11/20  Yes Biagio Borg, MD  HYDROcodone-acetaminophen [facility]) 10-325 MG tablet Take 1 tablet by mouth See admin instructions. 1 tablet every night at bedtime, may take an additional tablet during the day if needed for pain. 02/27/19  Yes [provider]  levothyroxine (SYNTHROID) 75 MCG tablet Take 1 tablet (75 mcg total) by mouth daily. Patient needs office visit. Patient taking differently: Take 75 mcg by mouth daily. 08/23/21  Yes Biagio Borg, MD  meloxicam (MOBIC) 7.5 MG tablet Take 1 tablet (7.5 mg total) by mouth 2 (two) times daily. Patient taking differently: Take 7.5 mg by mouth See admin instructions. 7.5 mg at bedtime, may take an additional 7.5 mg during the day as needed for pain. 08/11/20  Yes Biagio Borg, MD  metoprolol tartrate (LOPRESSOR) 50 MG tablet TAKE 1 TABLET (50 MG TOTAL) BY MOUTH 2 (TWO) TIMES DAILY. PATIENT NEEDS OFFICE VISIT. Patient taking differently: Take 50 mg by mouth 2 (two) times daily. 09/09/21  Yes Biagio Borg, MD  naproxen (NAPROSYN) 375 MG tablet Take 1 tablet (375 mg total) by mouth 2 (two) times daily. 04/26/22  Yes Kommor, [place], MD  amitriptyline (ELAVIL) 100 MG tablet Take 1 tablet (100 mg total) by mouth at bedtime. Patient not taking: Reported on 04/26/2022 08/11/20   Biagio Borg, MD  atorvastatin (LIPITOR) 20 MG tablet Take 1 tablet (20 mg total) by mouth daily. Patient not taking: Reported  on 04/26/2022 08/11/20 06/29/22  Biagio Borg, MD  citalopram (CELEXA) 20 MG tablet TAKE 1 TABLET BY MOUTH EVERY DAY Patient not taking: Reported on 04/26/2022 04/20/21   Biagio Borg, MD  hydrochlorothiazide (HYDRODIURIL) 25 MG tablet Take 1 tablet (25 mg total) by mouth daily. Patient not taking: Reported on 04/26/2022 08/11/20   Biagio Borg, MD      Allergies    Asa [aspirin] and Sulfonamide derivatives    Review of Systems   Review of Systems Review of systems Negative for f/c.  A 10  point review of systems was performed and is negative unless otherwise reported in HPI.  Physical Exam Updated Vital Signs BP (!) 193/83   Pulse 70   Temp 98 F (36.7 C)   Resp 17   Ht 5' 11"$  (1.803 m)   Wt 124.3 kg   SpO2 96%   BMI 38.22 kg/m  Physical Exam General: Normal appearing female, lying in bed.  HEENT: PERRLA, EOMI, no nystagmus, Sclera anicteric, MMM, trachea midline. Tongue protrudes midline. No tenderness of temples. No neck stiffness.  Cardiology: RRR, no murmurs/rubs/gallops. BL radial and DP pulses equal bilaterally.  Resp: Normal respiratory rate and effort. CTAB, no wheezes, rhonchi, crackles.  Abd: Soft, non-tender, non-distended. No rebound tenderness or guarding.  GU: Deferred. MSK: No peripheral edema or signs of trauma. Extremities without deformity or TTP. No cyanosis or clubbing. Skin: warm, dry. No rashes or lesions. Back: No CVA tenderness Neuro: A&Ox4, CNs II-XII grossly intact. 5/5 strength in all four extremities. Sensation grossly intact. Normal coordination, normal speech.  Psych: Normal mood and affect.   ED Results / Procedures / Treatments   Labs (all labs ordered are listed, but only abnormal results are displayed) Labs Reviewed  BASIC METABOLIC PANEL - Abnormal; Notable for the following components:      Result Value   Creatinine, Ser 1.09 (*)    GFR, Estimated 58 (*)    All other components within normal limits  CBC WITH DIFFERENTIAL/PLATELET - Abnormal; Notable for the following components:   Lymphs Abs 4.8 (*)    All other components within normal limits    EKG EKG Interpretation  Date/Time:  Friday April 26 2022 11:11:03 EST Ventricular Rate:  68 PR Interval:  140 QRS Duration: 80 QT Interval:  416 QTC Calculation: 442 R Axis:   47 Text Interpretation: Normal sinus rhythm Septal infarct , age undetermined Confirmed by Cindee Lame [phone]) on 04/26/2022 2:12:06 PM  Radiology MRV: Normal MRV head with no evidence of  venous sinus stenosis or thrombosis.  Procedures Procedures    Medications Ordered in ED Medications  LORazepam (ATIVAN) tablet 1 mg (1 mg Oral Given 04/26/22 1306)  gadobutrol (GADAVIST) 1 MMOL/ML injection 10 mL (10 mLs Intravenous Contrast Given 04/26/22 1351)  lactated ringers bolus 1,000 mL (0 mLs Intravenous Stopped 04/26/22 1915)  ketorolac (TORADOL) 15 MG/ML injection 15 mg (15 mg Intravenous Given 04/26/22 1443)  prochlorperazine (COMPAZINE) injection 10 mg (10 mg Intravenous Given 04/26/22 1443)  diphenhydrAMINE (BENADRYL) capsule 25 mg (25 mg Oral Given 04/26/22 1443)  acetaminophen (TYLENOL) tablet 1,000 mg (1,000 mg Oral Given 04/26/22 1443)  magnesium sulfate IVPB 2 g 50 mL (0 g Intravenous Stopped 04/26/22 1915)    ED Course/ Medical Decision Making/ A&P                          Medical Decision Making Amount and/or Complexity of Data Reviewed Radiology:  Decision-making details documented in ED Course.  Risk OTC drugs. Prescription drug management.   This patient presents to the ED for concern of headache with arm numbness; this involves an extensive number of treatment options, and is a complaint that carries with it a high risk of complications and morbidity.  I considered the following differential and admission for this acute, potentially life threatening condition.   MDM:    Patient is overall very well-appearing, HDS, non-toxic, afebrile.   Most likely complex migraine, peripheral vertigo, orthostasis or lightheadedness/dehydration, however Neuro exam is benign but patient does endorse arm numbness that comes and goes.  not similar to headaches in the past. MRV which had been ordered at previous encounter was ordered from triage and completed, negative for venous sinus thrombosis or stenosis. Will call to discuss with radiology about what is definitively ruled out on MRV. Consider also brain tumor, IIH, hydrocephalus. Patient is also noted to be hypertensive to [id number]  systolic, consider hypertensive emergency or PRES syndrome though no confusion/seizure activity. She does not drink alcohol to indicate wernicke's. Very low concern at this time for ICH, meningitis given no fever or neck stiffness, temporal arteritis with no temporal tenderness, AACG, CO poisoning. Will treatment the patient symptomatically and reassess. Will also evaluate for dehydration/renal injury, electrolyte derangements, anemia, hypo/hyperglycemia w/ basic labs.    Clinical Course as of 05/15/22 1020  Fri Apr 26, 2022  1257 BMP, CBC unremarkable [HN]  1411 MR Venogram Head IMPRESSION: Normal MRV head with no evidence of venous sinus stenosis or thrombosis.   [HN]  [id number] Patient requesting headache cocktail. Will give tylenol, toradol, LR, compazine, benadryl, and magnesium. [HN]  [id number] Discussed with radiology about MRV. No venous sinus stenosis and sella looks normal. No optic disc flattening, optic nerve sheath not particularly distended. Cannot strictly rule out CVA.  However patient's NIHSS at this time is 0, she has only intermittent symptoms, and believe that her symptoms are more likely due to headache or hypertension than primary CVA. [HN]    Clinical Course User Index [HN] Audley Hose, MD    Labs: I Ordered, and personally interpreted labs.  The pertinent results include:  those listed above  Imaging Studies ordered: Imaging studies including MRV ordered from triage I independently visualized and interpreted imaging. I agree with the radiologist interpretation  Additional history obtained from chart review, daughter at bedside.    Cardiac Monitoring: The patient was maintained on a cardiac monitor.  I personally viewed and interpreted the cardiac monitored which showed an underlying rhythm of: NSR  Reevaluation: After the interventions noted above, I reevaluated the patient and found that they have :improved  Social Determinants of Health: Patient lives  independently   Disposition:  Patient is signed out to the oncoming ED physician who is made aware of her history, presentation, exam, workup, and plan. Plan is to administer headache cocktail to patient and reevaluate her symptoms. If symptoms have not improved, will likely need to d/w neurology.    Co morbidities that complicate the patient evaluation  Past Medical History:  Diagnosis Date   ANXIETY    ASTHMA    BACK PAIN    Recurrent   DEPRESSION    Headache(784.0)    Migrane   HYPERLIPIDEMIA    Hypertension    Hypothyroidism 02/26/2018   Mitral valve prolapse    PAIN, CHRONIC NEC    Personal history of colonic adenomas 09/02/2012   SYMPTOM, PALPITATIONS      Medicines Meds  ordered this encounter  Medications   LORazepam (ATIVAN) tablet 1 mg   gadobutrol (GADAVIST) 1 MMOL/ML injection 10 mL   lactated ringers bolus 1,000 mL   ketorolac (TORADOL) 15 MG/ML injection 15 mg   prochlorperazine (COMPAZINE) injection 10 mg   diphenhydrAMINE (BENADRYL) capsule 25 mg   acetaminophen (TYLENOL) tablet 1,000 mg   magnesium sulfate IVPB 2 g 50 mL   naproxen (NAPROSYN) 375 MG tablet    Sig: Take 1 tablet (375 mg total) by mouth 2 (two) times daily.    Dispense:  20 tablet    Refill:  0    I have reviewed the patients home medicines and have made adjustments as needed  Problem List / ED Course: Problem List Items Addressed This Visit   None Visit Diagnoses     Other migraine without status migrainosus, not intractable    -  Primary   Relevant Medications   ketorolac (TORADOL) 15 MG/ML injection 15 mg (Completed)   acetaminophen (TYLENOL) tablet 1,000 mg (Completed)   naproxen (NAPROSYN) 375 MG tablet                   This note was created using dictation software, which may contain spelling or grammatical errors.    Audley Hose, MD 05/15/22 1027

## 2022-05-21 DIAGNOSIS — F411 Generalized anxiety disorder: Secondary | ICD-10-CM | POA: Diagnosis not present

## 2022-05-21 DIAGNOSIS — R519 Headache, unspecified: Secondary | ICD-10-CM | POA: Diagnosis not present

## 2022-05-21 DIAGNOSIS — I10 Essential (primary) hypertension: Secondary | ICD-10-CM | POA: Diagnosis not present

## 2022-05-21 DIAGNOSIS — E039 Hypothyroidism, unspecified: Secondary | ICD-10-CM | POA: Diagnosis not present

## 2022-05-21 DIAGNOSIS — F322 Major depressive disorder, single episode, severe without psychotic features: Secondary | ICD-10-CM | POA: Diagnosis not present

## 2022-05-29 DIAGNOSIS — I10 Essential (primary) hypertension: Secondary | ICD-10-CM | POA: Diagnosis not present

## 2022-06-05 DIAGNOSIS — I10 Essential (primary) hypertension: Secondary | ICD-10-CM | POA: Diagnosis not present

## 2022-06-05 DIAGNOSIS — Z0130 Encounter for examination of blood pressure without abnormal findings: Secondary | ICD-10-CM | POA: Diagnosis not present

## 2022-06-14 DIAGNOSIS — Z0130 Encounter for examination of blood pressure without abnormal findings: Secondary | ICD-10-CM | POA: Diagnosis not present

## 2022-06-14 DIAGNOSIS — I10 Essential (primary) hypertension: Secondary | ICD-10-CM | POA: Diagnosis not present

## 2022-07-11 DIAGNOSIS — R7301 Impaired fasting glucose: Secondary | ICD-10-CM | POA: Diagnosis not present

## 2022-07-11 DIAGNOSIS — L989 Disorder of the skin and subcutaneous tissue, unspecified: Secondary | ICD-10-CM | POA: Diagnosis not present

## 2022-07-11 DIAGNOSIS — F411 Generalized anxiety disorder: Secondary | ICD-10-CM | POA: Diagnosis not present

## 2022-07-11 DIAGNOSIS — E559 Vitamin D deficiency, unspecified: Secondary | ICD-10-CM | POA: Diagnosis not present

## 2022-07-11 DIAGNOSIS — F322 Major depressive disorder, single episode, severe without psychotic features: Secondary | ICD-10-CM | POA: Diagnosis not present

## 2022-07-11 DIAGNOSIS — E039 Hypothyroidism, unspecified: Secondary | ICD-10-CM | POA: Diagnosis not present

## 2022-07-11 DIAGNOSIS — Z1211 Encounter for screening for malignant neoplasm of colon: Secondary | ICD-10-CM | POA: Diagnosis not present

## 2022-07-11 DIAGNOSIS — Z1231 Encounter for screening mammogram for malignant neoplasm of breast: Secondary | ICD-10-CM | POA: Diagnosis not present

## 2022-07-11 DIAGNOSIS — I10 Essential (primary) hypertension: Secondary | ICD-10-CM | POA: Diagnosis not present

## 2022-07-11 DIAGNOSIS — E538 Deficiency of other specified B group vitamins: Secondary | ICD-10-CM | POA: Diagnosis not present

## 2022-07-11 DIAGNOSIS — N6312 Unspecified lump in the right breast, upper inner quadrant: Secondary | ICD-10-CM | POA: Diagnosis not present

## 2022-07-11 DIAGNOSIS — Z0000 Encounter for general adult medical examination without abnormal findings: Secondary | ICD-10-CM | POA: Diagnosis not present

## 2022-07-24 ENCOUNTER — Encounter: Payer: Self-pay | Admitting: Neurology

## 2022-07-24 ENCOUNTER — Ambulatory Visit: Payer: Medicare HMO | Admitting: Neurology

## 2022-07-24 DIAGNOSIS — Z79899 Other long term (current) drug therapy: Secondary | ICD-10-CM | POA: Diagnosis not present

## 2022-07-24 DIAGNOSIS — M5416 Radiculopathy, lumbar region: Secondary | ICD-10-CM | POA: Diagnosis not present

## 2022-07-24 DIAGNOSIS — G894 Chronic pain syndrome: Secondary | ICD-10-CM | POA: Diagnosis not present
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
## 2022-08-06 DIAGNOSIS — H524 Presbyopia: Secondary | ICD-10-CM | POA: Diagnosis not present

## 2022-08-06 DIAGNOSIS — H2513 Age-related nuclear cataract, bilateral: Secondary | ICD-10-CM | POA: Diagnosis not present

## 2022-08-14 DIAGNOSIS — L989 Disorder of the skin and subcutaneous tissue, unspecified: Secondary | ICD-10-CM | POA: Diagnosis not present

## 2022-08-14 DIAGNOSIS — R2232 Localized swelling, mass and lump, left upper limb: Secondary | ICD-10-CM | POA: Diagnosis not present

## 2022-08-19 ENCOUNTER — Other Ambulatory Visit: Payer: Self-pay | Admitting: Internal Medicine

## 2022-10-21 DIAGNOSIS — E538 Deficiency of other specified B group vitamins: Secondary | ICD-10-CM | POA: Diagnosis not present

## 2022-11-18 DIAGNOSIS — E538 Deficiency of other specified B group vitamins: Secondary | ICD-10-CM | POA: Diagnosis not present

## 2023-01-30 DIAGNOSIS — M7062 Trochanteric bursitis, left hip: Secondary | ICD-10-CM | POA: Diagnosis not present

## 2023-01-30 DIAGNOSIS — M5416 Radiculopathy, lumbar region: Secondary | ICD-10-CM | POA: Diagnosis not present

## 2023-01-30 DIAGNOSIS — G894 Chronic pain syndrome: Secondary | ICD-10-CM | POA: Diagnosis not present

## 2023-02-17 DIAGNOSIS — E538 Deficiency of other specified B group vitamins: Secondary | ICD-10-CM | POA: Diagnosis not present

## 2023-02-17 DIAGNOSIS — Z23 Encounter for immunization: Secondary | ICD-10-CM | POA: Diagnosis not present

## 2023-03-14 DIAGNOSIS — L720 Epidermal cyst: Secondary | ICD-10-CM | POA: Diagnosis not present

## 2023-03-14 DIAGNOSIS — L821 Other seborrheic keratosis: Secondary | ICD-10-CM | POA: Diagnosis not present

## 2024-02-19 ENCOUNTER — Emergency Department (HOSPITAL_COMMUNITY)

## 2024-02-19 ENCOUNTER — Other Ambulatory Visit: Payer: Self-pay

## 2024-02-19 ENCOUNTER — Encounter (HOSPITAL_COMMUNITY): Payer: Self-pay | Admitting: Emergency Medicine

## 2024-02-19 ENCOUNTER — Emergency Department (HOSPITAL_COMMUNITY)
Admission: EM | Admit: 2024-02-19 | Discharge: 2024-02-19 | Disposition: A | Discharge status: Home / Self Care | Attending: Emergency Medicine | Admitting: Emergency Medicine

## 2024-02-19 DIAGNOSIS — R519 Headache, unspecified: Secondary | ICD-10-CM | POA: Insufficient documentation

## 2024-02-19 LAB — CBC WITH DIFFERENTIAL/PLATELET
Abs Immature Granulocytes: 0.02 K/uL (ref 0.00–0.07)
Basophils Absolute: 0.1 K/uL (ref 0.0–0.1)
Basophils Relative: 1 %
Eosinophils Absolute: 0.1 K/uL (ref 0.0–0.5)
Eosinophils Relative: 1 %
HCT: 41.7 % (ref 36.0–46.0)
Hemoglobin: 13.9 g/dL (ref 12.0–15.0)
Immature Granulocytes: 0 %
Lymphocytes Relative: 29 %
Lymphs Abs: 2.2 K/uL (ref 0.7–4.0)
MCH: 31.5 pg (ref 26.0–34.0)
MCHC: 33.3 g/dL (ref 30.0–36.0)
MCV: 94.6 fL (ref 80.0–100.0)
Monocytes Absolute: 0.4 K/uL (ref 0.1–1.0)
Monocytes Relative: 6 %
Neutro Abs: 4.9 K/uL (ref 1.7–7.7)
Neutrophils Relative %: 63 %
Platelets: 211 K/uL (ref 150–400)
RBC: 4.41 MIL/uL (ref 3.87–5.11)
RDW: 11.9 % (ref 11.5–15.5)
WBC: 7.7 K/uL (ref 4.0–10.5)
nRBC: 0 % (ref 0.0–0.2)

## 2024-02-19 LAB — URINALYSIS, ROUTINE W REFLEX MICROSCOPIC
Bilirubin Urine: NEGATIVE
Glucose, UA: NEGATIVE mg/dL
Hgb urine dipstick: NEGATIVE
Ketones, ur: NEGATIVE mg/dL
Leukocytes,Ua: NEGATIVE
Nitrite: POSITIVE — AB
Protein, ur: NEGATIVE mg/dL
Specific Gravity, Urine: 1.011 (ref 1.005–1.030)
pH: 7 (ref 5.0–8.0)

## 2024-02-19 LAB — BASIC METABOLIC PANEL WITH GFR
Anion gap: 12 (ref 5–15)
BUN: 10 mg/dL (ref 8–23)
CO2: 24 mmol/L (ref 22–32)
Calcium: 9.2 mg/dL (ref 8.9–10.3)
Chloride: 104 mmol/L (ref 98–111)
Creatinine, Ser: 0.96 mg/dL (ref 0.44–1.00)
GFR, Estimated: >60 mL/min (ref >60)
Glucose, Bld: 106 mg/dL — ABNORMAL HIGH (ref 70–99)
Potassium: 4.3 mmol/L (ref 3.5–5.1)
Sodium: 140 mmol/L (ref 135–145)

## 2024-02-19 MED ORDER — MECLIZINE HCL 25 MG PO TABS
25.0000 mg | ORAL_TABLET | Freq: Once | ORAL | Status: AC
  Administered 2024-02-19: 25 mg via ORAL
  Filled 2024-02-19: qty 1

## 2024-02-19 MED ORDER — MECLIZINE HCL 25 MG PO TABS: 25.0000 mg | ORAL_TABLET | Freq: Three times a day (TID) | ORAL | 0 refills | Status: AC | PRN

## 2024-02-19 MED ORDER — DIPHENHYDRAMINE HCL 50 MG/ML IJ SOLN
25.0000 mg | Freq: Once | INTRAMUSCULAR | Status: AC
  Administered 2024-02-19: 25 mg via INTRAVENOUS
  Filled 2024-02-19: qty 1

## 2024-02-19 MED ORDER — METOCLOPRAMIDE HCL 5 MG/ML IJ SOLN
10.0000 mg | Freq: Once | INTRAMUSCULAR | Status: AC
  Administered 2024-02-19: 10 mg via INTRAVENOUS
  Filled 2024-02-19: qty 2

## 2024-02-19 MED ORDER — KETOROLAC TROMETHAMINE 15 MG/ML IJ SOLN
15.0000 mg | Freq: Once | INTRAMUSCULAR | Status: AC
  Administered 2024-02-19: 15 mg via INTRAMUSCULAR
  Filled 2024-02-19: qty 1

## 2024-02-19 MED ORDER — DEXAMETHASONE SOD PHOSPHATE PF 10 MG/ML IJ SOLN
10.0000 mg | Freq: Once | INTRAMUSCULAR | Status: AC
  Administered 2024-02-19: 10 mg via INTRAVENOUS

## 2024-02-19 MED ORDER — MAGNESIUM SULFATE IN D5W 1-5 GM/100ML-% IV SOLN
1.0000 g | Freq: Once | INTRAVENOUS | Status: AC
  Administered 2024-02-19: 1 g via INTRAVENOUS
  Filled 2024-02-19: qty 100

## 2024-02-19 NOTE — Discharge Instructions (Signed)
 Today you are seen for a headache.  You have been prescribed meclizine  for your dizziness.  You have also been given a referral to neurology.  If they do not contact you in the next several days, you should contact them to schedule an appointment.  Please return to the ED if you have double vision, numbness, weakness, or uncontrollable vomiting.  Thank you for letting us  treat you today. After reviewing your labs and imaging, I feel you are safe to go home. Please follow up with your PCP in the next several days and provide them with your records from this visit. Return to the Emergency Room if pain becomes severe or symptoms worsen.

## 2024-02-19 NOTE — ED Triage Notes (Signed)
 Patient presents due to migraines, dizziness and nausea for the past 3 days. She has a history of migraines, but usually doesn't have dizziness with it.     HX migraines 40 year   EMS vitals: 84 HR 174/82 BP 115 CBG 98% SPO2 on room air 16 RR 5/10 pain

## 2024-02-19 NOTE — ED Notes (Signed)
 Patient reports feeling the room is spinning.

## 2024-02-19 NOTE — ED Provider Notes (Signed)
 [place] EMERGENCY DEPARTMENT AT [facility] Provider Note   CSN: [id number] Arrival date & time: 02/19/24  1437     Patient presents with: Migraine   [name] is a 63 y.o. female presents today for headache x 3 days.  Patient also reports dizziness and nausea.  Patient reports history of migraines but states that she typically does not have dizziness associated with it.  Patient denies diplopia, tinnitus, vomiting, fever, neck stiffness, or any other complaints at this time.    Migraine Associated symptoms include headaches.       Prior to Admission medications   Medication Sig Start Date End Date Taking? Authorizing Provider  meclizine  (ANTIVERT ) 25 MG tablet Take 1 tablet (25 mg total) by mouth 3 (three) times daily as needed for dizziness. 02/19/24  Yes Corneisha Alvi N, PA-C  amitriptyline  (ELAVIL ) 100 MG tablet Take 1 tablet (100 mg total) by mouth at bedtime. Patient not taking: Reported on 04/26/2022 08/11/20   Norleen Lynwood ORN, MD  amLODipine  (NORVASC ) 5 MG tablet Take 1 tablet (5 mg total) by mouth daily. 04/24/22 05/24/22  Sponseller, Pleasant SAUNDERS, PA-C  atorvastatin  (LIPITOR) 20 MG tablet Take 1 tablet (20 mg total) by mouth daily. Patient not taking: Reported on 04/26/2022 08/11/20 06/29/22  Norleen Lynwood ORN, MD  citalopram  (CELEXA ) 20 MG tablet TAKE 1 TABLET BY MOUTH EVERY DAY 04/20/21   Norleen Lynwood ORN, MD  cyclobenzaprine  (FLEXERIL ) 10 MG tablet Take 1 tablet (10 mg total) by mouth 3 (three) times daily as needed. Patient taking differently: Take 10 mg by mouth See admin instructions. 10 mg by mouth every night + 10 mg during the day as needed for muscle spasms. 08/11/20   Norleen Lynwood ORN, MD  gabapentin  (NEURONTIN ) 300 MG capsule Take 1 capsule (300 mg total) by mouth 3 (three) times daily. Patient taking differently: Take 300-600 capsules by mouth See admin instructions. 300 mg twice daily, 600 mg at bedtime. 08/11/20   Norleen Lynwood ORN, MD  hydrochlorothiazide   (HYDRODIURIL ) 25 MG tablet Take 1 tablet (25 mg total) by mouth daily. Patient not taking: Reported on 04/26/2022 08/11/20   Norleen Lynwood ORN, MD  HYDROcodone -acetaminophen  (NORCO) 10-325 MG tablet Take 1 tablet by mouth See admin instructions. 1 tablet every night at bedtime, may take an additional tablet during the day if needed for pain. 02/27/19   [provider]  levothyroxine  (SYNTHROID ) 75 MCG tablet Take 1 tablet (75 mcg total) by mouth daily. Patient needs office visit. Patient taking differently: Take 75 mcg by mouth daily. 08/23/21   Norleen Lynwood ORN, MD  meloxicam  (MOBIC ) 7.5 MG tablet Take 1 tablet (7.5 mg total) by mouth 2 (two) times daily. Patient taking differently: Take 7.5 mg by mouth See admin instructions. 7.5 mg at bedtime, may take an additional 7.5 mg during the day as needed for pain. 08/11/20   Norleen Lynwood ORN, MD  metoprolol  tartrate (LOPRESSOR ) 50 MG tablet TAKE 1 TABLET (50 MG TOTAL) BY MOUTH 2 (TWO) TIMES DAILY. PATIENT NEEDS OFFICE VISIT. Patient taking differently: Take 50 mg by mouth 2 (two) times daily. 09/09/21   Norleen Lynwood ORN, MD  naproxen  (NAPROSYN ) 375 MG tablet Take 1 tablet (375 mg total) by mouth 2 (two) times daily. 04/26/22   Kommor, Lum, MD    Allergies: Asa [aspirin], Buprenorphine, Sulfonamide derivatives, and Naproxen     Review of Systems  Gastrointestinal:  Positive for nausea.  Neurological:  Positive for dizziness and headaches.    Updated Vital  Signs BP (!) 153/65 (BP Location: Right Wrist)   Pulse 76   Temp 97.9 F (36.6 C) (Oral)   Resp 16   SpO2 98%   Physical Exam Vitals and nursing note reviewed.  Constitutional:      General: She is not in acute distress.    Appearance: She is well-developed. She is not toxic-appearing.  HENT:     Head: Normocephalic and atraumatic.  Eyes:     Extraocular Movements: Extraocular movements intact.     Conjunctiva/sclera: Conjunctivae normal.     Pupils: Pupils are equal, round, and reactive to  light.  Cardiovascular:     Rate and Rhythm: Normal rate and regular rhythm.     Pulses: Normal pulses.     Heart sounds: Normal heart sounds. No murmur heard. Pulmonary:     Effort: Pulmonary effort is normal. No respiratory distress.     Breath sounds: Normal breath sounds.  Abdominal:     Palpations: Abdomen is soft.     Tenderness: There is no abdominal tenderness.  Musculoskeletal:        General: No swelling.     Cervical back: Normal range of motion and neck supple. No rigidity.  Skin:    General: Skin is warm and dry.     Capillary Refill: Capillary refill takes less than 2 seconds.  Neurological:     General: No focal deficit present.     Mental Status: She is alert and oriented to person, place, and time.     Sensory: No sensory deficit.     Motor: No weakness.  Psychiatric:        Mood and Affect: Mood normal.     (all labs ordered are listed, but only abnormal results are displayed) Labs Reviewed  BASIC METABOLIC PANEL WITH GFR - Abnormal; Notable for the following components:      Result Value   Glucose, Bld 106 (*)    All other components within normal limits  URINALYSIS, ROUTINE W REFLEX MICROSCOPIC - Abnormal; Notable for the following components:   Nitrite POSITIVE (*)    Bacteria, UA MANY (*)    All other components within normal limits  URINE CULTURE  CBC WITH DIFFERENTIAL/PLATELET    EKG: None  Radiology: CT Head Wo Contrast Result Date: 02/19/2024 CLINICAL DATA:  Headache, increasing frequency or severity EXAM: CT HEAD WITHOUT CONTRAST TECHNIQUE: Contiguous axial images were obtained from the base of the skull through the vertex without intravenous contrast. RADIATION DOSE REDUCTION: This exam was performed according to the departmental dose-optimization program which includes automated exposure control, adjustment of the mA and/or kV according to patient size and/or use of iterative reconstruction technique. COMPARISON:  04/26/2022, 04/23/2022  FINDINGS: Brain: No acute infarct or hemorrhage. Lateral ventricles and midline structures are unremarkable. No acute extra-axial fluid collections. No mass effect. Vascular: No hyperdense vessel or unexpected calcification. Skull: Normal. Negative for fracture or focal lesion. Sinuses/Orbits: No acute finding. Other: None. IMPRESSION: 1. No acute intracranial process. Electronically Signed   By: Ozell Daring M.D.   On: 02/19/2024 17:27     Procedures   Medications Ordered in the ED  dexamethasone  (DECADRON ) injection 10 mg (10 mg Intravenous Given 02/19/24 1635)  metoCLOPramide  (REGLAN ) injection 10 mg (10 mg Intravenous Given 02/19/24 1635)  magnesium  sulfate IVPB 1 g 100 mL (0 g Intravenous Stopped 02/19/24 1820)  diphenhydrAMINE  (BENADRYL ) injection 25 mg (25 mg Intravenous Given 02/19/24 1635)  meclizine  (ANTIVERT ) tablet 25 mg (25 mg Oral Given 02/19/24 1816)  ketorolac  (TORADOL ) 15 MG/ML injection 15 mg (15 mg Intramuscular Given 02/19/24 1819)                                    Medical Decision Making  This patient presents to the ED for concern of headache, nausea, dizziness differential diagnosis includes headache, brain bleed, mass, vertigo    Additional history obtained   Additional history obtained from Electronic Medical Record External records from outside source obtained and reviewed including neurology and internal medicine note   Lab Tests:  I Ordered, and personally interpreted labs.  The pertinent results include: CBC unremarkable, UA with positive nitrites and many bacteria (culture sent), BMP unremarkable   Imaging Studies ordered:  I ordered imaging studies including CT head Noncon I independently visualized and interpreted imaging which showed no acute intracranial process I agree with the radiologist interpretation   Medicines ordered and prescription drug management:  I ordered medication including migraine cocktail, meclizine     I have  reviewed the patients home medicines and have made adjustments as needed   Problem List / ED Course:  Upon reassessment patient states that her symptoms have resolved. Considered for admission or further workup however patient's vital signs, physical exam, labs, and imaging are reassuring.  Patient given course of meclizine  outpatient for possible vertigo/dizziness.  Patient given referral to neurology as she has not seen neurology in several years.  Patient given return precautions.  I feel patient is safe for discharge at this time.        Final diagnoses:  Acute nonintractable headache, unspecified headache type    ED Discharge Orders          Ordered    meclizine  (ANTIVERT ) 25 MG tablet  3 times daily PRN        02/19/24 1855    Ambulatory referral to Neurology       Comments: An appointment is requested in approximately: 4 weeks   02/19/24 1856               Francis Ileana LOISE DEVONNA 02/19/24 JACKEY Dasie Faden, MD 02/21/24 1546

## 2024-02-20 NOTE — Progress Notes (Addendum)
 [facility] Transitional Care Management     ED VISIT ONLY   Situation   [name] is a 63 y.o. female who was contacted today for a transitional care outreach.  Admission Date: 02/19/24  Discharge Date:02/19/24   Institution: [facility]     Diagnosis:  Acute nonintractable headache, unspecified headache type (Primary Dx)   Is this visit eligible for TCM? No  Background   Since Discharge: 02/23/24: unable to reach patient for ED follow-up call. Left message for patient to reach out to HN or PCP office with any needs or to schedule appt with PCP.  Primary Care Provider on Record: Meghan Garwin Roys, PA-C   Assessment    General Assessment     None           Recommendation    PCP/specialist notified: no  Referral Made: no     No future appointments.      Electronically signed by: Suzen GORMAN Sharps 02/20/2024 11:14 AM

## 2024-02-21 LAB — URINE CULTURE: Culture: >=100000 — AB

## 2024-02-22 ENCOUNTER — Telehealth (HOSPITAL_BASED_OUTPATIENT_CLINIC_OR_DEPARTMENT_OTHER): Payer: Self-pay | Admitting: *Deleted

## 2024-02-22 NOTE — Telephone Encounter (Signed)
 Post ED Visit - Positive Culture Follow-up: Unsuccessful Patient Follow-up  Culture assessed and recommendations reviewed by:  [x]  Camelia Marina, Pharm.D. []  Venetia Gully, Pharm.D., BCPS AQ-ID []  Garrel Crews, Pharm.D., BCPS []  Almarie Lunger, Pharm.D., BCPS []  [place], [address].D., BCPS, AAHIVP []  Rosaline Bihari, Pharm.D., BCPS, AAHIVP []  Massie Rigg, PharmD []  Jodie Rower, PharmD, BCPS  Positive urine culture  [x]  Patient discharged without antimicrobial prescription and treatment may be indicated []  Organism is resistant to prescribed ED discharge antimicrobial []  Patient with positive blood cultures  Plan: Symptom check:  If asymptomatic- no treatment needed If symptomatic- Amoxil  500mg  PO TID x 5d per Carlo Cornish, PA  Unable to contact patient  letter will be sent to address on file  [name] 02/22/2024, 1:46 PM

## 2024-06-04 ENCOUNTER — Ambulatory Visit: Admitting: Diagnostic Neuroimaging
